# Patient Record
Sex: Female | Born: 1985 | Race: White | Hispanic: No | Marital: Single | Smoking: Current every day smoker
Health system: Southern US, Community
[De-identification: ages and names within clinical notes are randomized; demographics above are authoritative.]

## PROBLEM LIST (undated history)

## (undated) ENCOUNTER — Inpatient Hospital Stay (HOSPITAL_COMMUNITY): Payer: Self-pay

## (undated) DIAGNOSIS — F32A Depression, unspecified: Secondary | ICD-10-CM

## (undated) DIAGNOSIS — F419 Anxiety disorder, unspecified: Secondary | ICD-10-CM

## (undated) DIAGNOSIS — F329 Major depressive disorder, single episode, unspecified: Secondary | ICD-10-CM

## (undated) DIAGNOSIS — G43709 Chronic migraine without aura, not intractable, without status migrainosus: Secondary | ICD-10-CM

## (undated) DIAGNOSIS — D649 Anemia, unspecified: Secondary | ICD-10-CM

## (undated) DIAGNOSIS — F319 Bipolar disorder, unspecified: Secondary | ICD-10-CM

## (undated) HISTORY — PX: DILATION AND CURETTAGE OF UTERUS: SHX78

## (undated) HISTORY — DX: Anxiety disorder, unspecified: F41.9

## (undated) HISTORY — DX: Chronic migraine without aura, not intractable, without status migrainosus: G43.709

## (undated) HISTORY — DX: Major depressive disorder, single episode, unspecified: F32.9

## (undated) HISTORY — DX: Bipolar disorder, unspecified: F31.9

## (undated) HISTORY — DX: Depression, unspecified: F32.A

---

## 2016-08-02 NOTE — L&D Delivery Note (Signed)
Delivery Note At  a viable female was delivered via  (Presentation:vertex ;LOA  ).  APGAR:9 ,9 ; weight  .   Placenta status:spont , shultz.  Cord:  with the following complications: .  Cord pH: n/a  Anesthesia:  epidural Episiotomy:  none Lacerations:   Suture Repair: n/a Est. Blood Loss 100 (mL):    Mom to OR fot BTL.  Baby to Couplet care / Skin to Skin.  [name] 05/01/2017, 2:28 PM

## 2016-10-04 ENCOUNTER — Encounter (HOSPITAL_COMMUNITY): Payer: Self-pay | Admitting: Emergency Medicine

## 2016-10-04 ENCOUNTER — Emergency Department (HOSPITAL_COMMUNITY)
Admission: EM | Admit: 2016-10-04 | Discharge: 2016-10-04 | Disposition: A | Discharge status: Home / Self Care | Payer: Medicaid Other | Attending: Emergency Medicine | Admitting: Emergency Medicine

## 2016-10-04 DIAGNOSIS — Z3A10 10 weeks gestation of pregnancy: Secondary | ICD-10-CM | POA: Diagnosis not present

## 2016-10-04 DIAGNOSIS — F17200 Nicotine dependence, unspecified, uncomplicated: Secondary | ICD-10-CM | POA: Diagnosis not present

## 2016-10-04 DIAGNOSIS — O219 Vomiting of pregnancy, unspecified: Secondary | ICD-10-CM | POA: Insufficient documentation

## 2016-10-04 DIAGNOSIS — Z79899 Other long term (current) drug therapy: Secondary | ICD-10-CM | POA: Insufficient documentation

## 2016-10-04 DIAGNOSIS — O99331 Smoking (tobacco) complicating pregnancy, first trimester: Secondary | ICD-10-CM | POA: Insufficient documentation

## 2016-10-04 LAB — COMPREHENSIVE METABOLIC PANEL
ALBUMIN: 4.2 g/dL (ref 3.5–5.0)
ALT: 30 U/L (ref 14–54)
AST: 21 U/L (ref 15–41)
Alkaline Phosphatase: 65 U/L (ref 38–126)
Anion gap: 9 (ref 5–15)
BILIRUBIN TOTAL: 0.4 mg/dL (ref 0.3–1.2)
BUN: 11 mg/dL (ref 6–20)
CHLORIDE: 106 mmol/L (ref 101–111)
CO2: 21 mmol/L — AB (ref 22–32)
Calcium: 9.6 mg/dL (ref 8.9–10.3)
Creatinine, Ser: 0.55 mg/dL (ref 0.44–1.00)
GFR calc Af Amer: >60 mL/min (ref >60)
GFR calc non Af Amer: >60 mL/min (ref >60)
GLUCOSE: 96 mg/dL (ref 65–99)
POTASSIUM: 3.6 mmol/L (ref 3.5–5.1)
SODIUM: 136 mmol/L (ref 135–145)
TOTAL PROTEIN: 7.4 g/dL (ref 6.5–8.1)

## 2016-10-04 LAB — CBC
HEMATOCRIT: 41.7 % (ref 36.0–46.0)
Hemoglobin: 14.1 g/dL (ref 12.0–15.0)
MCH: 27.9 pg (ref 26.0–34.0)
MCHC: 33.8 g/dL (ref 30.0–36.0)
MCV: 82.6 fL (ref 78.0–100.0)
Platelets: 289 10*3/uL (ref 150–400)
RBC: 5.05 MIL/uL (ref 3.87–5.11)
RDW: 13.7 % (ref 11.5–15.5)
WBC: 10.6 10*3/uL — AB (ref 4.0–10.5)

## 2016-10-04 LAB — URINALYSIS, ROUTINE W REFLEX MICROSCOPIC
BACTERIA UA: NONE SEEN
Glucose, UA: NEGATIVE mg/dL
KETONES UR: 5 mg/dL — AB
LEUKOCYTES UA: NEGATIVE
Nitrite: NEGATIVE
Protein, ur: 30 mg/dL — AB
SPECIFIC GRAVITY, URINE: 1.031 — AB (ref 1.005–1.030)
pH: 5 (ref 5.0–8.0)

## 2016-10-04 LAB — I-STAT BETA HCG BLOOD, ED (MC, WL, AP ONLY)

## 2016-10-04 LAB — LIPASE, BLOOD: Lipase: 18 U/L (ref 11–51)

## 2016-10-04 MED ORDER — ONDANSETRON 4 MG PO TBDP
4.0000 mg | ORAL_TABLET | Freq: Once | ORAL | Status: AC
  Administered 2016-10-04: 4 mg via ORAL
  Filled 2016-10-04: qty 1

## 2016-10-04 MED ORDER — METOCLOPRAMIDE HCL 10 MG PO TABS: 10.0000 mg | ORAL_TABLET | Freq: Three times a day (TID) | ORAL | 0 refills | Status: DC | PRN

## 2016-10-04 NOTE — ED Triage Notes (Signed)
Patient reports she is "4 or [redacted] weeks pregnant." States she has had N/V x2 weeks. Denies abdominal pain, vaginal bleeding, or loss of fluids. Ambulatory to triage.

## 2016-10-04 NOTE — ED Provider Notes (Signed)
[facility]-EMERGENCY DEPT Provider Note   CSN: [id number] Arrival date & time: 10/04/16  1312     History   Chief Complaint Chief Complaint  Patient presents with  . Nausea  . Emesis    HPI Doris King is a 31 y.o. female.  Patient is a 31 year old female who presents with nausea and vomiting. Her last menstrual period was December 24. She recently took a home pregnancy test that was positive. She is G6 P5. She complains of ongoing nausea and vomiting. She states she hasn't been able keep anything down the last few days. She denies abdominal pain. No vaginal bleeding or discharge. She recently moved to the area and does not have an OB/GYN. She denies any urinary symptoms.      History reviewed. No pertinent past medical history.  There are no active problems to display for this patient.   Past Surgical History:  Procedure Laterality Date  . CESAREAN SECTION      OB History    Gravida Para Term Preterm AB Living   1             SAB TAB Ectopic Multiple Live Births                   Home Medications    Prior to Admission medications   Medication Sig Start Date End Date Taking? Authorizing Provider  Prenatal Vit-Fe Fumarate-FA (MULTIVITAMIN-PRENATAL) 27-0.8 MG TABS tablet Take 1 tablet by mouth daily at 12 noon.   Yes Historical Provider, MD  metoCLOPramide (REGLAN) 10 MG tablet Take 1 tablet (10 mg total) by mouth every 8 (eight) hours as needed for nausea (nausea/headache). 10/04/16   Rolan BuccoMelanie Riccardo Holeman, MD    Family History History reviewed. No pertinent family history.  Social History Social History  Substance Use Topics  . Smoking status: Current Every Day Smoker  . Smokeless tobacco: Never Used  . Alcohol use No     Allergies   Patient has no known allergies.   Review of Systems Review of Systems  Constitutional: Negative for chills, diaphoresis, fatigue and fever.  HENT: Negative for congestion, rhinorrhea and sneezing.   Eyes: Negative.     Respiratory: Negative for cough, chest tightness and shortness of breath.   Cardiovascular: Negative for chest pain and leg swelling.  Gastrointestinal: Positive for nausea and vomiting. Negative for abdominal pain, blood in stool and diarrhea.  Genitourinary: Negative for difficulty urinating, flank pain, frequency, hematuria, vaginal bleeding and vaginal discharge.  Musculoskeletal: Negative for arthralgias and back pain.  Skin: Negative for rash.  Neurological: Negative for dizziness, speech difficulty, weakness, numbness and headaches.     Physical Exam Updated Vital Signs BP 130/77 (BP Location: Left Arm)   Pulse 92   Temp 98.6 F (37 C) (Oral)   Resp 18   Ht 5\' 4"  (1.626 m)   Wt 190 lb (86.2 kg)   LMP 07/25/2016   SpO2 95%   BMI 32.61 kg/m   Physical Exam  Constitutional: She is oriented to person, place, and time. She appears well-developed and well-nourished.  HENT:  Head: Normocephalic and atraumatic.  Eyes: Pupils are equal, round, and reactive to light.  Neck: Normal range of motion. Neck supple.  Cardiovascular: Normal rate, regular rhythm and normal heart sounds.   Pulmonary/Chest: Effort normal and breath sounds normal. No respiratory distress. She has no wheezes. She has no rales. She exhibits no tenderness.  Abdominal: Soft. Bowel sounds are normal. There is no tenderness. There is no rebound  and no guarding.  Musculoskeletal: Normal range of motion. She exhibits no edema.  Lymphadenopathy:    She has no cervical adenopathy.  Neurological: She is alert and oriented to person, place, and time.  Skin: Skin is warm and dry. No rash noted.  Psychiatric: She has a normal mood and affect.     ED Treatments / Results  Labs (all labs ordered are listed, but only abnormal results are displayed) Labs Reviewed  COMPREHENSIVE METABOLIC PANEL - Abnormal; Notable for the following:       Result Value   CO2 21 (*)    All other components within normal limits  CBC  - Abnormal; Notable for the following:    WBC 10.6 (*)    All other components within normal limits  URINALYSIS, ROUTINE W REFLEX MICROSCOPIC - Abnormal; Notable for the following:    Color, Urine AMBER (*)    APPearance HAZY (*)    Specific Gravity, Urine 1.031 (*)    Hgb urine dipstick LARGE (*)    Bilirubin Urine SMALL (*)    Ketones, ur 5 (*)    Protein, ur 30 (*)    Squamous Epithelial / LPF 0-5 (*)    All other components within normal limits  I-STAT BETA HCG BLOOD, ED ([facility], [facility], [facility] ONLY) - Abnormal; Notable for the following:    I-stat hCG, quantitative >2,000.0 (*)    All other components within normal limits  LIPASE, BLOOD    EKG  EKG Interpretation None       Radiology No results found.  Procedures Procedures (including critical care time)  Medications Ordered in ED Medications  ondansetron (ZOFRAN-ODT) disintegrating tablet 4 mg (4 mg Oral Given 10/04/16 1802)     Initial Impression / Assessment and Plan / ED Course  I have reviewed the triage vital signs and the nursing notes.  Pertinent labs & imaging results that were available during my care of the patient were reviewed by me and considered in my medical decision making (see chart for details).     Patient presents with nausea and vomiting pregnancy. By dates she is 10 weeks 1 day gestational age. She denies any abdominal pain or vaginal bleeding. Her urinalysis does show some blood but no other signs of infection. She denies any symptoms of a UTI. Given the blood in her urine, a vaginal exam was performed. There is no visible bleeding or abnormal discharge. No tenderness on bimanual exam. She was discharged home in good condition. She was given a dose of Zofran in the ED and was able to tolerate oral fluids. I will give her prescription for Reglan to use at home. She was given other instructions to help prevent nausea during pregnancy. She was encouraged to have close follow-up with an OB/GYN. She was given  referral to the women's outpatient clinic. She was encouraged to start a prenatal vitamin. She was advised to return to emergency department if she has any abdominal pain or vaginal bleeding. She was instructed to go the [facility] in MAU should this occur.  Final Clinical Impressions(s) / ED Diagnoses   Final diagnoses:  Nausea and vomiting in pregnancy    New Prescriptions New Prescriptions   METOCLOPRAMIDE (REGLAN) 10 MG TABLET    Take 1 tablet (10 mg total) by mouth every 8 (eight) hours as needed for nausea (nausea/headache).     Rolan Bucco, MD 10/04/16 [phone]

## 2016-10-04 NOTE — ED Notes (Signed)
Patient given sprite for PO challenge.  

## 2016-10-20 ENCOUNTER — Inpatient Hospital Stay (HOSPITAL_COMMUNITY)
Admission: AD | Admit: 2016-10-20 | Discharge: 2016-10-20 | Disposition: A | Discharge status: Home / Self Care | Payer: Medicaid Other | Source: Ambulatory Visit | Attending: Obstetrics and Gynecology | Admitting: Obstetrics and Gynecology

## 2016-10-20 ENCOUNTER — Encounter (HOSPITAL_COMMUNITY): Payer: Self-pay | Admitting: *Deleted

## 2016-10-20 DIAGNOSIS — R109 Unspecified abdominal pain: Secondary | ICD-10-CM | POA: Diagnosis not present

## 2016-10-20 DIAGNOSIS — O23591 Infection of other part of genital tract in pregnancy, first trimester: Secondary | ICD-10-CM | POA: Diagnosis not present

## 2016-10-20 DIAGNOSIS — B9689 Other specified bacterial agents as the cause of diseases classified elsewhere: Secondary | ICD-10-CM | POA: Diagnosis not present

## 2016-10-20 DIAGNOSIS — O26892 Other specified pregnancy related conditions, second trimester: Secondary | ICD-10-CM | POA: Diagnosis not present

## 2016-10-20 DIAGNOSIS — Z3A12 12 weeks gestation of pregnancy: Secondary | ICD-10-CM | POA: Insufficient documentation

## 2016-10-20 DIAGNOSIS — N760 Acute vaginitis: Secondary | ICD-10-CM

## 2016-10-20 DIAGNOSIS — R102 Pelvic and perineal pain: Secondary | ICD-10-CM | POA: Diagnosis present

## 2016-10-20 DIAGNOSIS — Z87891 Personal history of nicotine dependence: Secondary | ICD-10-CM | POA: Diagnosis not present

## 2016-10-20 HISTORY — DX: Anemia, unspecified: D64.9

## 2016-10-20 LAB — WET PREP, GENITAL
SPERM: NONE SEEN
TRICH WET PREP: NONE SEEN
YEAST WET PREP: NONE SEEN

## 2016-10-20 LAB — URINALYSIS, ROUTINE W REFLEX MICROSCOPIC
Bilirubin Urine: NEGATIVE
GLUCOSE, UA: 150 mg/dL — AB
KETONES UR: 5 mg/dL — AB
Leukocytes, UA: NEGATIVE
Nitrite: NEGATIVE
PH: 5 (ref 5.0–8.0)
PROTEIN: 30 mg/dL — AB
RBC / HPF: NONE SEEN RBC/hpf (ref 0–5)
Specific Gravity, Urine: 1.027 (ref 1.005–1.030)
WBC UA: NONE SEEN WBC/hpf (ref 0–5)

## 2016-10-20 LAB — CBC
HEMATOCRIT: 37.9 % (ref 36.0–46.0)
Hemoglobin: 12.6 g/dL (ref 12.0–15.0)
MCH: 27.8 pg (ref 26.0–34.0)
MCHC: 33.2 g/dL (ref 30.0–36.0)
MCV: 83.7 fL (ref 78.0–100.0)
PLATELETS: 260 10*3/uL (ref 150–400)
RBC: 4.53 MIL/uL (ref 3.87–5.11)
RDW: 13.6 % (ref 11.5–15.5)
WBC: 9.5 10*3/uL (ref 4.0–10.5)

## 2016-10-20 MED ORDER — METRONIDAZOLE 500 MG PO TABS: 500.0000 mg | ORAL_TABLET | Freq: Two times a day (BID) | ORAL | 0 refills | Status: DC

## 2016-10-20 NOTE — Discharge Instructions (Signed)
Abdominal Pain During Pregnancy °Belly (abdominal) pain is common during pregnancy. Most of the time, it is not a serious problem. Other times, it can be a sign that something is wrong with the pregnancy. Always tell your doctor if you have belly pain. °Follow these instructions at home: °Monitor your belly pain for any changes. The following actions may help you feel better: °· Do not have sex (intercourse) or put anything in your vagina until you feel better. °· Rest until your pain stops. °· Drink clear fluids if you feel sick to your stomach (nauseous). Do not eat solid food until you feel better. °· Only take medicine as told by your doctor. °· Keep all doctor visits as told. °Get help right away if: °· You are bleeding, leaking fluid, or pieces of tissue come out of your vagina. °· You have more pain or cramping. °· You keep throwing up (vomiting). °· You have pain when you pee (urinate) or have blood in your pee. °· You have a fever. °· You do not feel your baby moving as much. °· You feel very weak or feel like passing out. °· You have trouble breathing, with or without belly pain. °· You have a very bad headache and belly pain. °· You have fluid leaking from your vagina and belly pain. °· You keep having watery poop (diarrhea). °· Your belly pain does not go away after resting, or the pain gets worse. °This information is not intended to replace advice given to you by your health care provider. Make sure you discuss any questions you have with your health care provider. °Document Released: 07/07/2009 Document Revised: 02/25/2016 Document Reviewed: 02/15/2013 °Elsevier Interactive Patient Education © 2017 Elsevier [employer] ° °

## 2016-10-20 NOTE — MAU Note (Signed)
Urine in lab. No current orders are in

## 2016-10-20 NOTE — MAU Note (Signed)
Pt presents with c/o RLQ abdominal pain which began this morning.  States pain is intermittent & sharp. Denies VB.

## 2016-10-20 NOTE — MAU Provider Note (Signed)
History     CSN: [id number]  Arrival date and time: 10/20/16 1556   First Provider Initiated Contact with Patient 10/20/16 1657      Chief Complaint  Patient presents with  . Abdominal Pain   HPI   [name] is a 31 y.o. female here in MAU with pelvic pain. The pain is located in the middle and near the right side "it feels like the bone". The pain comes and goes. Movement does not exacerbate the pain. The pain has been going on since this morning.   Denies vaginal bleeding.   OB History    Gravida Para Term Preterm AB Living   7 5 4 1 1 5    SAB TAB Ectopic Multiple Live Births   0 1 0 0 5      Past Medical History:  Diagnosis Date  . Anemia     Past Surgical History:  Procedure Laterality Date  . CESAREAN SECTION      History reviewed. No pertinent family history.  Social History  Substance Use Topics  . Smoking status: Former Smoker    Types: Cigarettes    Quit date: 10/06/2016  . Smokeless tobacco: Never Used  . Alcohol use No    Allergies: No Known Allergies  Prescriptions Prior to Admission  Medication Sig Dispense Refill Last Dose  . metoCLOPramide (REGLAN) 10 MG tablet Take 1 tablet (10 mg total) by mouth every 8 (eight) hours as needed for nausea (nausea/headache). 10 tablet 0   . Prenatal Vit-Fe Fumarate-FA (MULTIVITAMIN-PRENATAL) 27-0.8 MG TABS tablet Take 1 tablet by mouth daily at 12 noon.   10/04/2016 at Unknown time   Results for orders placed or performed during the hospital encounter of 10/20/16 (from the past 48 hour(s))  Urinalysis, Routine w reflex microscopic     Status: Abnormal   Collection Time: 10/20/16  4:09 PM  Result Value Ref Range   Color, Urine YELLOW YELLOW   APPearance TURBID (A) CLEAR   Specific Gravity, Urine 1.027 1.005 - 1.030   pH 5.0 5.0 - 8.0   Glucose, UA 150 (A) NEGATIVE mg/dL   Hgb urine dipstick LARGE (A) NEGATIVE   Bilirubin Urine NEGATIVE NEGATIVE   Ketones, ur 5 (A) NEGATIVE mg/dL   Protein, ur 30  (A) NEGATIVE mg/dL   Nitrite NEGATIVE NEGATIVE   Leukocytes, UA NEGATIVE NEGATIVE   RBC / HPF NONE SEEN 0 - 5 RBC/hpf   WBC, UA NONE SEEN 0 - 5 WBC/hpf   Bacteria, UA FEW (A) NONE SEEN   Squamous Epithelial / LPF 6-30 (A) NONE SEEN   Mucous PRESENT   Wet prep, genital     Status: Abnormal   Collection Time: 10/20/16  5:06 PM  Result Value Ref Range   Yeast Wet Prep HPF POC NONE SEEN NONE SEEN   Trich, Wet Prep NONE SEEN NONE SEEN   Clue Cells Wet Prep HPF POC PRESENT (A) NONE SEEN   WBC, Wet Prep HPF POC FEW (A) NONE SEEN    Comment: MODERATE BACTERIA SEEN   Sperm NONE SEEN   CBC     Status: None   Collection Time: 10/20/16  5:41 PM  Result Value Ref Range   WBC 9.5 4.0 - 10.5 K/uL   RBC 4.53 3.87 - 5.11 MIL/uL   Hemoglobin 12.6 12.0 - 15.0 g/dL   HCT 40.937.9 81.136.0 - 91.446.0 %   MCV 83.7 78.0 - 100.0 fL   MCH 27.8 26.0 - 34.0 pg   MCHC 33.2  30.0 - 36.0 g/dL   RDW 98.1 19.1 - 47.8 %   Platelets 260 150 - 400 K/uL    Review of Systems  Constitutional: Negative for fever.  Gastrointestinal: Positive for constipation. Negative for diarrhea, nausea and vomiting.  Genitourinary: Positive for pelvic pain and vaginal bleeding. Negative for dysuria.   Physical Exam   Blood pressure (!) 125/56, pulse 80, temperature 98.6 F (37 C), temperature source Oral, resp. rate 18, height 5\' 4"  (1.626 m), weight 203 lb (92.1 kg), last menstrual period 07/25/2016, SpO2 98 %.  Physical Exam  Constitutional: She is oriented to person, place, and time. She appears well-developed and well-nourished. No distress.  HENT:  Head: Normocephalic.  Eyes: Pupils are equal, round, and reactive to light.  GI: Soft. Normal appearance. There is tenderness in the right lower quadrant and suprapubic area. There is no rigidity, no rebound and no guarding.  Genitourinary:  Genitourinary Comments: Wet prep & GC collected without speculum Cervix: closed, thick, posterior.   Musculoskeletal: Normal range of motion.   Neurological: She is alert and oriented to person, place, and time.  Skin: Skin is warm. She is not diaphoretic.  Psychiatric: Her behavior is normal.    MAU Course  Procedures None.   MDM  Wet prep & GC CBC Urine culture pending + fetal heart tones.    Assessment and Plan   A:  1. Abdominal pain in pregnancy, second trimester   2. BV (bacterial vaginosis)     P:  Discharge home in stable condition Rx: Flagyl List of OB providers given Patient encouraged to start prenatal care.  Return to MAU if symptoms worsen    Duane Lope, NP 10/20/2016 7:15 PM

## 2016-10-21 LAB — GC/CHLAMYDIA PROBE AMP (~~LOC~~) NOT AT ARMC
Chlamydia: NEGATIVE
NEISSERIA GONORRHEA: NEGATIVE

## 2016-10-23 LAB — CULTURE, OB URINE: Special Requests: NORMAL

## 2016-11-11 ENCOUNTER — Other Ambulatory Visit (HOSPITAL_COMMUNITY): Payer: Self-pay | Admitting: Nurse Practitioner

## 2016-11-11 DIAGNOSIS — O09292 Supervision of pregnancy with other poor reproductive or obstetric history, second trimester: Secondary | ICD-10-CM

## 2016-11-11 DIAGNOSIS — Z8751 Personal history of pre-term labor: Secondary | ICD-10-CM

## 2016-11-11 LAB — OB RESULTS CONSOLE ABO/RH: RH Type: POSITIVE

## 2016-11-11 LAB — OB RESULTS CONSOLE HEPATITIS B SURFACE ANTIGEN: Hepatitis B Surface Ag: NEGATIVE

## 2016-11-11 LAB — OB RESULTS CONSOLE HIV ANTIBODY (ROUTINE TESTING): HIV: NONREACTIVE

## 2016-11-11 LAB — OB RESULTS CONSOLE VARICELLA ZOSTER ANTIBODY, IGG: Varicella: NON-IMMUNE/NOT IMMUNE

## 2016-11-11 LAB — OB RESULTS CONSOLE GC/CHLAMYDIA
CHLAMYDIA, DNA PROBE: NEGATIVE
GC PROBE AMP, GENITAL: NEGATIVE

## 2016-11-11 LAB — OB RESULTS CONSOLE ANTIBODY SCREEN: Antibody Screen: NEGATIVE

## 2016-11-11 LAB — OB RESULTS CONSOLE RPR: RPR: NONREACTIVE

## 2016-11-11 LAB — CYTOLOGY - PAP: Pap: ABNORMAL — AB

## 2016-11-11 LAB — CYSTIC FIBROSIS DIAGNOSTIC STUDY: INTERPRETATION-CFDNA: NEGATIVE

## 2016-11-11 LAB — GLUCOSE TOLERANCE, 1 HOUR: GLUCOSE 1 HOUR GTT: 117

## 2016-11-11 LAB — OB RESULTS CONSOLE RUBELLA ANTIBODY, IGM: RUBELLA: IMMUNE

## 2016-11-16 ENCOUNTER — Encounter (HOSPITAL_COMMUNITY): Payer: Self-pay

## 2016-11-22 ENCOUNTER — Encounter: Payer: Self-pay | Admitting: *Deleted

## 2016-11-26 ENCOUNTER — Encounter: Payer: Self-pay | Admitting: *Deleted

## 2016-11-29 ENCOUNTER — Encounter: Payer: Self-pay | Admitting: Obstetrics and Gynecology

## 2016-11-29 ENCOUNTER — Ambulatory Visit (INDEPENDENT_AMBULATORY_CARE_PROVIDER_SITE_OTHER): Payer: Medicaid Other | Admitting: Obstetrics and Gynecology

## 2016-11-29 DIAGNOSIS — F32A Depression, unspecified: Secondary | ICD-10-CM | POA: Insufficient documentation

## 2016-11-29 DIAGNOSIS — O09212 Supervision of pregnancy with history of pre-term labor, second trimester: Secondary | ICD-10-CM | POA: Diagnosis not present

## 2016-11-29 DIAGNOSIS — Z8759 Personal history of other complications of pregnancy, childbirth and the puerperium: Secondary | ICD-10-CM

## 2016-11-29 DIAGNOSIS — Z789 Other specified health status: Secondary | ICD-10-CM

## 2016-11-29 DIAGNOSIS — Z720 Tobacco use: Secondary | ICD-10-CM

## 2016-11-29 DIAGNOSIS — O0990 Supervision of high risk pregnancy, unspecified, unspecified trimester: Secondary | ICD-10-CM | POA: Insufficient documentation

## 2016-11-29 DIAGNOSIS — Z87448 Personal history of other diseases of urinary system: Secondary | ICD-10-CM | POA: Insufficient documentation

## 2016-11-29 DIAGNOSIS — F1210 Cannabis abuse, uncomplicated: Secondary | ICD-10-CM

## 2016-11-29 DIAGNOSIS — Z98891 History of uterine scar from previous surgery: Secondary | ICD-10-CM | POA: Diagnosis not present

## 2016-11-29 DIAGNOSIS — O0932 Supervision of pregnancy with insufficient antenatal care, second trimester: Secondary | ICD-10-CM

## 2016-11-29 DIAGNOSIS — O09892 Supervision of other high risk pregnancies, second trimester: Secondary | ICD-10-CM

## 2016-11-29 DIAGNOSIS — O0992 Supervision of high risk pregnancy, unspecified, second trimester: Secondary | ICD-10-CM

## 2016-11-29 DIAGNOSIS — O99342 Other mental disorders complicating pregnancy, second trimester: Secondary | ICD-10-CM | POA: Diagnosis not present

## 2016-11-29 DIAGNOSIS — O99340 Other mental disorders complicating pregnancy, unspecified trimester: Secondary | ICD-10-CM

## 2016-11-29 DIAGNOSIS — O99212 Obesity complicating pregnancy, second trimester: Secondary | ICD-10-CM

## 2016-11-29 DIAGNOSIS — N879 Dysplasia of cervix uteri, unspecified: Secondary | ICD-10-CM | POA: Diagnosis present

## 2016-11-29 DIAGNOSIS — O0930 Supervision of pregnancy with insufficient antenatal care, unspecified trimester: Secondary | ICD-10-CM

## 2016-11-29 DIAGNOSIS — F329 Major depressive disorder, single episode, unspecified: Secondary | ICD-10-CM | POA: Insufficient documentation

## 2016-11-29 DIAGNOSIS — O99210 Obesity complicating pregnancy, unspecified trimester: Secondary | ICD-10-CM

## 2016-11-29 LAB — POCT URINALYSIS DIP (DEVICE)
Bilirubin Urine: NEGATIVE
Glucose, UA: NEGATIVE mg/dL
KETONES UR: NEGATIVE mg/dL
LEUKOCYTES UA: NEGATIVE
Nitrite: NEGATIVE
PROTEIN: NEGATIVE mg/dL
Specific Gravity, Urine: 1.02 (ref 1.005–1.030)
Urobilinogen, UA: 0.2 mg/dL (ref 0.0–1.0)
pH: 6 (ref 5.0–8.0)

## 2016-11-29 NOTE — Progress Notes (Signed)
New OB Note  11/29/2016   [facility]  Chief Complaint: NOB  Transfer of Care Patient: no  History of Present Illness: [name] is a 31 y.o. [id number] @ 18/1 weeks (EDC [tentative], based on 9/30 Patient's last menstrual period was 07/25/2016.).  Preg complicated by has Cervical dysplasia; History of cesarean delivery; Severe obesity (BMI 35.0-39.9) (HCC); Obesity in pregnancy; History of incarceration; Marijuana abuse; Tobacco abuse; History of preterm delivery, currently pregnant in second trimester; Late prenatal care; Depression, anxiety, bipolar; History of pyelonephritis; Supervision of high risk pregnancy, antepartum; and History of placenta abruption on her problem list.   Any events prior to today's visit: no She has Negative signs or symptoms of nausea/vomiting of pregnancy. She has Negative signs or symptoms of miscarriage or preterm labor: neg On any different medications around the time she conceived/early pregnancy: THC, tobacco  ROS: A 12-point review of systems was performed and negative, except as stated in the above HPI.  OBGYN History: As per HPI. OB History  Gravida Para Term Preterm AB Living  SAB TAB Ectopic Multiple Live Births  0 1 0 0 5    # Outcome Date GA Lbr Len/2nd Weight Sex Delivery Anes PTL Lv  8 Current           7 Preterm 08/2015 [redacted]w[redacted]d            Birth Comments: started bleeding   6 TAB           5 Preterm  [redacted]w[redacted]d         4 Term           3 Term           2 Term           1 Term             Obstetric Comments  G1: 2006. TSVD 6lbs 3oz. No issues  G2: 2007. TSVD. 6lbs 3oz. Multiple pyelo admits  G3: 2010. PPROM at 35-36wks. SVD. 5lbs 8oz  G4: 2011. EAB at 18wks   G5: 2004. TSVD 6lbs 3oz  G6: 2013 urgent c/s for what sounds like abruption. Term. Pt never told she couldn't labor in the future. 6lbs  G7: 2017 SVD. Pt was incarcerated and felt like she had to go to bathroom and then delivered. Didn't have  to go to hospital. She states that they took her to an OB office in [facility] and they removed some "placenta"   Prior children are healthy, doing well, and without any problems or issues: yes History of pap smears: Yes. Last pap smear 10/2016 and results were HSIL. Pt states she has a h/o abnormal paps but denies any surgeries on her cx.    Past Medical History: Past Medical History:  Diagnosis Date  . Anemia   . Anxiety   . Bipolar affective disorder (HCC)   . Depression     Past Surgical History: Past Surgical History:  Procedure Laterality Date  . CESAREAN SECTION    . DILATION AND CURETTAGE OF UTERUS     x2 (rPOCs x 1 and EAB x 1)    Family History:  History reviewed. No pertinent family history. She denies any female cancers, bleeding or blood clotting disorders.  She denies any history of mental retardation, birth defects or genetic disorders in her or the FOB's history  Social History:  Social History   Social History  . Marital status: Single  Spouse name: N/A  . Number of children: N/A  . Years of education: N/A   Occupational History  . Not on file.   Social History Main Topics  . Smoking status: Former Smoker    Types: Cigarettes    Quit date: 10/06/2016  . Smokeless tobacco: Never Used  . Alcohol use No  . Drug use: No  . Sexual activity: Yes    Birth control/ protection: None   Other Topics Concern  . Not on file   Social History Narrative  . 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 Contractions: Not present Vag. Bleeding: None. Fundal height: not applicable FHTs: 140s  General appearance: Well nourished, well developed female in no acute distress.  Neck:  Supple, normal appearance, and no thyromegaly  Cardiovascular: S1, S2  normal, no murmur, rub or gallop, regular rate and rhythm Respiratory:  Clear to auscultation bilateral. Normal respiratory effort Abdomen: positive bowel sounds and no masses, hernias; diffusely non tender to palpation, non distended Neuro/Psych:  Normal mood and affect.  Skin:  Warm and dry.  Lymphatic:  No inguinal lymphadenopathy.   Pelvic exam: is not limited by body habitus EGBUS: within normal limits, Vagina: within normal limits and with no blood in the vault, Cervix: ectropion vs possible cervical dysplasia site (approx 1.5cm x 1.5cm on posterior lip), non friable, cervix without discharge or lesions, closed/long/high, Uterus:  enlarged, c/w 14-16 week size, and Adnexa:  normal adnexa and no mass, fullness, tenderness  Laboratory: 4/17: O pos/CF neg/varicella NI/UDS +THC and tobacco/GC-CT neg/CBC neg/quad neg on 4-12/HIV neg/rubella neg/hepB neg/rpr neg/early 1hr 117  Imaging:  none  Assessment: pt stable  Plan: 1. Cervical dysplasia D/w her importance of follow up and need for colpo. Will schedule at check out.   2. History of cesarean delivery See above. Likely vbac candidate will get her to sign release or records.   3. Severe obesity (BMI 35.0-39.9) (HCC) No issues  4. Obesity in pregnancy No issues. Early 1hr negative.   5. History of incarceration No issues currently  6. Marijuana abuse Not currently using  7. Tobacco abuse Not currently using  8. History of preterm delivery, currently pregnant in second trimester D/w her and she is amenable to 17p and cervical lengths through 24wks. - [place] MFM OB Transvaginal; Future - [place] MFM OB Transvaginal; Future - [place] MFM OB Transvaginal; Future - Ambulatory referral to [facility] - [place] MFM OB Transvaginal; Future  9. Late prenatal care No change in plan of care.   10. Depression during pregnancy, antepartum Also h/o anxiety and bipolar. Pt unable to stay today to see [facility]. Resources given for  walk in clinics   11. History of pyelonephritis Consider qtrimester UCx  12. Supervision of high risk pregnancy, antepartum See above. Finalize edc. Resign BTL papers later in pregnancy.   13. History of placenta abruption No change in plan of care  Problem list reviewed and updated.  Follow up in 2 weeks.  >50% of 30 min visit spent on counseling and coordination of care.     Cornelia Copa MD Attending [facility])

## 2016-12-03 ENCOUNTER — Ambulatory Visit (HOSPITAL_COMMUNITY): Payer: Medicaid Other

## 2016-12-07 ENCOUNTER — Telehealth: Payer: Self-pay | Admitting: *Deleted

## 2016-12-07 NOTE — Telephone Encounter (Addendum)
[facility] yesterday [facility]. She wanted to know if the Prior Berkley Harveyauth has been completed and [facility]'s Makena auto injector. Please notify them when this has been completed.  Pt will be receiving 2 courtesy [facility] will be delivered on 5/9 so that therapy can be initiated while pre-auth is being completed.

## 2016-12-10 ENCOUNTER — Ambulatory Visit (HOSPITAL_COMMUNITY)
Admission: RE | Admit: 2016-12-10 | Discharge: 2016-12-10 | Disposition: A | Discharge status: Home / Self Care | Payer: Medicaid Other | Source: Ambulatory Visit | Attending: Nurse Practitioner | Admitting: Nurse Practitioner

## 2016-12-10 ENCOUNTER — Ambulatory Visit (HOSPITAL_COMMUNITY): Admission: RE | Admit: 2016-12-10 | Payer: Medicaid Other | Source: Ambulatory Visit

## 2016-12-10 ENCOUNTER — Encounter (HOSPITAL_COMMUNITY): Payer: Self-pay

## 2016-12-10 DIAGNOSIS — Z3A19 19 weeks gestation of pregnancy: Secondary | ICD-10-CM | POA: Insufficient documentation

## 2016-12-10 DIAGNOSIS — O09212 Supervision of pregnancy with history of pre-term labor, second trimester: Secondary | ICD-10-CM

## 2016-12-10 DIAGNOSIS — O99212 Obesity complicating pregnancy, second trimester: Secondary | ICD-10-CM | POA: Insufficient documentation

## 2016-12-10 DIAGNOSIS — O09892 Supervision of other high risk pregnancies, second trimester: Secondary | ICD-10-CM

## 2016-12-10 DIAGNOSIS — Z3689 Encounter for other specified antenatal screening: Secondary | ICD-10-CM | POA: Diagnosis not present

## 2016-12-10 DIAGNOSIS — O09299 Supervision of pregnancy with other poor reproductive or obstetric history, unspecified trimester: Secondary | ICD-10-CM | POA: Diagnosis not present

## 2016-12-10 DIAGNOSIS — O0912 Supervision of pregnancy with history of ectopic or molar pregnancy, second trimester: Secondary | ICD-10-CM | POA: Diagnosis not present

## 2016-12-10 DIAGNOSIS — O288 Other abnormal findings on antenatal screening of mother: Secondary | ICD-10-CM | POA: Diagnosis not present

## 2016-12-10 DIAGNOSIS — O09292 Supervision of pregnancy with other poor reproductive or obstetric history, second trimester: Secondary | ICD-10-CM

## 2016-12-10 DIAGNOSIS — Z8751 Personal history of pre-term labor: Secondary | ICD-10-CM

## 2016-12-13 ENCOUNTER — Institutional Professional Consult (permissible substitution): Payer: Medicaid Other

## 2016-12-13 ENCOUNTER — Encounter: Payer: Self-pay | Admitting: Obstetrics and Gynecology

## 2016-12-13 ENCOUNTER — Encounter: Payer: Medicaid Other | Admitting: Obstetrics and Gynecology

## 2016-12-13 DIAGNOSIS — O0990 Supervision of high risk pregnancy, unspecified, unspecified trimester: Secondary | ICD-10-CM

## 2016-12-13 NOTE — Progress Notes (Signed)
Dr. Vergie LivingPickens recommended to contact pt in regards to rescheduling for appt.  Unable to LM due to VM box not set up.  Front office will send letter.

## 2016-12-16 NOTE — Telephone Encounter (Signed)
[facility] to courtesy doses of Makena but was approved for the vials. The vials have been shipped out to the office.

## 2016-12-17 ENCOUNTER — Ambulatory Visit (HOSPITAL_COMMUNITY): Payer: Medicaid Other | Attending: Obstetrics and Gynecology

## 2016-12-31 ENCOUNTER — Ambulatory Visit (HOSPITAL_COMMUNITY): Payer: Medicaid Other | Attending: Obstetrics and Gynecology

## 2016-12-31 ENCOUNTER — Encounter (HOSPITAL_COMMUNITY): Payer: Self-pay

## 2017-01-05 ENCOUNTER — Telehealth: Payer: Self-pay | Admitting: *Deleted

## 2017-01-05 NOTE — Telephone Encounter (Signed)
Noted Doris King's [name] has arrived. I called and was unable to leave a message to tell her her medicine has arrived and we need to make an appt for her first injection.

## 2017-01-07 ENCOUNTER — Encounter: Payer: Self-pay | Admitting: *Deleted

## 2017-01-07 NOTE — Telephone Encounter (Signed)
Called pt and heard message stating that the person does not have a voice mail box set up yet. Per chart review, pt no showed for appt in this office on 5/14 and no showed for US appts on 5/18 and 6/1. She has US scheduled on 6/15 and does not have any future appts scheduled in this office. Certified letter composed and will be sent.

## 2017-01-11 ENCOUNTER — Telehealth: Payer: Self-pay | Admitting: Family Medicine

## 2017-01-11 ENCOUNTER — Encounter: Payer: Self-pay | Admitting: Family Medicine

## 2017-01-11 NOTE — Telephone Encounter (Signed)
Called both numbers in patient chart being her cell and emergency contact (mother). Was able to leave voicemail on mother's phone for patient to call office asap. We have been unsuccessful in reaching patient over several weeks via phone and mailed dnka letter on 01/07/2017. Will send another certified letter today.

## 2017-01-14 ENCOUNTER — Encounter (HOSPITAL_COMMUNITY): Payer: Self-pay

## 2017-01-14 ENCOUNTER — Ambulatory Visit (HOSPITAL_COMMUNITY): Payer: Medicaid Other | Attending: Obstetrics and Gynecology

## 2017-02-10 ENCOUNTER — Encounter (HOSPITAL_COMMUNITY): Payer: Self-pay | Admitting: *Deleted

## 2017-02-10 ENCOUNTER — Inpatient Hospital Stay (HOSPITAL_COMMUNITY)
Admission: AD | Admit: 2017-02-10 | Discharge: 2017-02-10 | Disposition: A | Discharge status: Home / Self Care | Payer: Medicaid Other | Source: Ambulatory Visit | Attending: Obstetrics & Gynecology | Admitting: Obstetrics & Gynecology

## 2017-02-10 ENCOUNTER — Inpatient Hospital Stay (HOSPITAL_COMMUNITY): Payer: Medicaid Other

## 2017-02-10 DIAGNOSIS — O98813 Other maternal infectious and parasitic diseases complicating pregnancy, third trimester: Secondary | ICD-10-CM | POA: Insufficient documentation

## 2017-02-10 DIAGNOSIS — O368330 Maternal care for abnormalities of the fetal heart rate or rhythm, third trimester, not applicable or unspecified: Secondary | ICD-10-CM | POA: Insufficient documentation

## 2017-02-10 DIAGNOSIS — Z3A28 28 weeks gestation of pregnancy: Secondary | ICD-10-CM | POA: Diagnosis not present

## 2017-02-10 DIAGNOSIS — B3731 Acute candidiasis of vulva and vagina: Secondary | ICD-10-CM

## 2017-02-10 DIAGNOSIS — O368390 Maternal care for abnormalities of the fetal heart rate or rhythm, unspecified trimester, not applicable or unspecified: Secondary | ICD-10-CM

## 2017-02-10 DIAGNOSIS — Z79899 Other long term (current) drug therapy: Secondary | ICD-10-CM | POA: Insufficient documentation

## 2017-02-10 DIAGNOSIS — Z87891 Personal history of nicotine dependence: Secondary | ICD-10-CM | POA: Diagnosis not present

## 2017-02-10 DIAGNOSIS — B373 Candidiasis of vulva and vagina: Secondary | ICD-10-CM | POA: Insufficient documentation

## 2017-02-10 DIAGNOSIS — F419 Anxiety disorder, unspecified: Secondary | ICD-10-CM | POA: Insufficient documentation

## 2017-02-10 DIAGNOSIS — F319 Bipolar disorder, unspecified: Secondary | ICD-10-CM | POA: Insufficient documentation

## 2017-02-10 DIAGNOSIS — O4702 False labor before 37 completed weeks of gestation, second trimester: Secondary | ICD-10-CM | POA: Diagnosis not present

## 2017-02-10 DIAGNOSIS — O9989 Other specified diseases and conditions complicating pregnancy, childbirth and the puerperium: Secondary | ICD-10-CM

## 2017-02-10 DIAGNOSIS — O479 False labor, unspecified: Secondary | ICD-10-CM

## 2017-02-10 DIAGNOSIS — B9689 Other specified bacterial agents as the cause of diseases classified elsewhere: Secondary | ICD-10-CM | POA: Diagnosis not present

## 2017-02-10 DIAGNOSIS — O34219 Maternal care for unspecified type scar from previous cesarean delivery: Secondary | ICD-10-CM | POA: Diagnosis not present

## 2017-02-10 DIAGNOSIS — O99343 Other mental disorders complicating pregnancy, third trimester: Secondary | ICD-10-CM | POA: Insufficient documentation

## 2017-02-10 DIAGNOSIS — Z9889 Other specified postprocedural states: Secondary | ICD-10-CM | POA: Diagnosis not present

## 2017-02-10 DIAGNOSIS — N760 Acute vaginitis: Secondary | ICD-10-CM

## 2017-02-10 LAB — URINALYSIS, ROUTINE W REFLEX MICROSCOPIC
BILIRUBIN URINE: NEGATIVE
Glucose, UA: 50 mg/dL — AB
Ketones, ur: NEGATIVE mg/dL
NITRITE: NEGATIVE
PROTEIN: NEGATIVE mg/dL
Specific Gravity, Urine: 1.017 (ref 1.005–1.030)
pH: 6 (ref 5.0–8.0)

## 2017-02-10 LAB — WET PREP, GENITAL
Sperm: NONE SEEN
Trich, Wet Prep: NONE SEEN

## 2017-02-10 LAB — RAPID URINE DRUG SCREEN, HOSP PERFORMED
AMPHETAMINES: NOT DETECTED
BARBITURATES: NOT DETECTED
BENZODIAZEPINES: NOT DETECTED
Cocaine: NOT DETECTED
Opiates: NOT DETECTED
TETRAHYDROCANNABINOL: POSITIVE — AB

## 2017-02-10 LAB — AMNISURE RUPTURE OF MEMBRANE (ROM) NOT AT ARMC: AMNISURE: NEGATIVE

## 2017-02-10 MED ORDER — TERCONAZOLE 0.4 % VA CREA: 1.0000 | TOPICAL_CREAM | Freq: Every day | VAGINAL | 0 refills | Status: DC

## 2017-02-10 MED ORDER — METRONIDAZOLE 500 MG PO TABS: 500.0000 mg | ORAL_TABLET | Freq: Two times a day (BID) | ORAL | 0 refills | Status: DC

## 2017-02-10 NOTE — MAU Provider Note (Signed)
Chief Complaint:  Rupture of Membranes and Contractions   First Provider Initiated Contact with Patient 02/10/17 1506     HPI: Doris King is a 31 y.o. [id number] at 2028w4dwho presents to maternity admissions reporting 1-2 painful braxton hicks contractions per hour and wetness in underwear for 2 weeks.  States was very wet last night.  Has not been in clinic since April   Does not have an explanation for this  States has an appt for July 30.Marland Kitchen. She reports good fetal movement, denies vaginal bleeding, vaginal itching/burning, urinary symptoms, h/a, dizziness, n/v, diarrhea, constipation or fever/chills.    Vaginal Discharge  The patient's primary symptoms include vaginal discharge. The patient's pertinent negatives include no genital itching, genital lesions, genital odor, pelvic pain or vaginal bleeding. This is a new problem. The current episode started 1 to 4 weeks ago. The problem occurs intermittently. The pain is mild. The problem affects both sides. She is pregnant. Associated symptoms include abdominal pain. Pertinent negatives include no chills, constipation, diarrhea or fever.    RN Note: Pt states she has been having "really bad braxton hicks contractions" just about every day for the last 2 weeks. Also reports for the last 2 weeks she has noticed that her underwear are staying wet .   Past Medical History: Past Medical History:  Diagnosis Date  . Anemia   . Anxiety   . Bipolar affective disorder (HCC)   . Depression     Past obstetric history: OB History  Gravida Para Term Preterm AB Living  8 6 4 2 1 5   SAB TAB Ectopic Multiple Live Births  0 1 0 0 5    # Outcome Date GA Lbr Len/2nd Weight Sex Delivery Anes PTL Lv  8 Current           7 Preterm 08/2015 3646w0d            Birth Comments: started bleeding   6 Term 05/14/13 7257w0d  6 lb 8 oz (2.948 kg) M CS-Unspec        Complications: Abruptio Placenta  5 Term 01/13/12 4341w0d  6 lb 7 oz (2.92 kg) M Vag-Spont  N LIV  4 TAB  2011          3 Preterm 11/21/08 2060w3d  5 lb 8 oz (2.495 kg) M Vag-Spont  N LIV  2 Term 12/04/05 7138w6d  6 lb 8 oz (2.948 kg) F Vag-Spont  N LIV  1 Term 11/16/04 7038w6d  6 lb 3 oz (2.807 kg) M Vag-Spont  N LIV    Obstetric Comments  G1: 2006. TSVD 6lbs 3oz. No issues  G2: 2007. TSVD. 6lbs 3oz. Multiple pyelo admits  G3: 2010. PPROM at 35-36wks. SVD. 5lbs 8oz  G4: 2011. EAB at 18wks   G5: 2004. TSVD 6lbs 3oz  G6: 2013 urgent c/s for what sounds like abruption. Term. Pt never told she couldn't labor in the future. 6lbs  G7: 2017 SVD. Pt was incarcerated and felt like she had to go to bathroom and then delivered. Didn't have to go to hospital. She [facility] "placenta"    Past Surgical History: Past Surgical History:  Procedure Laterality Date  . CESAREAN SECTION  2016  . DILATION AND CURETTAGE OF UTERUS     EAB at 18wks    Family History: No family history on file.  Social History: Social History  Substance Use Topics  . Smoking status:  Former Smoker    Types: Cigarettes    Quit date: 10/06/2016  . Smokeless tobacco: Never Used  . Alcohol use No    Allergies: No Known Allergies  Meds:  Prescriptions Prior to Admission  Medication Sig Dispense Refill Last Dose  . acetaminophen (TYLENOL) 325 MG tablet Take 650 mg by mouth every 6 (six) hours as needed.   Taking  . Prenatal Vit-Fe Fumarate-FA (PRENATAL MULTIVITAMIN) TABS tablet Take 1 tablet by mouth daily at 12 noon.   Taking    I have reviewed patient's Past Medical Hx, Surgical Hx, Family Hx, Social Hx, medications and allergies.   ROS:  Review of Systems  Constitutional: Negative for chills and fever.  Gastrointestinal: Positive for abdominal pain. Negative for constipation and diarrhea.  Genitourinary: Positive for vaginal discharge. Negative for pelvic pain.   Other systems negative  Physical Exam  No data found.  Constitutional: Well-developed,  well-nourished female in no acute distress.  Cardiovascular: normal rate and rhythm Respiratory: normal effort, clear to auscultation bilaterally GI: Abd soft, non-tender, gravid appropriate for gestational age.   No rebound or guarding. MS: Extremities nontender, no edema, normal ROM Neurologic: Alert and oriented x 4.  GU: Neg CVAT.  PELVIC EXAM: Cervix pink, visually closed, without lesion, scant white creamy discharge, vaginal walls and external genitalia normal Bimanual exam: Cervix firm, posterior, neg CMT, uterus nontender, Fundal Height consistent with dates, adnexa without tenderness, enlargement, or mass     Cervix long and closed  FHT:  Baseline 140 , moderate variability, accelerations present, two small variable decelerations Contractions:  Irregular  Rare   Labs: No results found for this or any previous visit (from the past 24 hour(s)). O/Positive/-- (04/12 0000)  Imaging:  BPP 8/8 AFI 17.83  MAU Course/MDM: I have ordered labs and reviewed results.  NST reviewed   Other than two small variable decels, FHR is reactive.  BPP was 8/8 and AFI was normal . Will discharge home with Rxes for BV and yeast Has appt in clinic on 7/30  Assessment: [facility] at [redacted]w[redacted]d Yeast vaginitis - Plan: Discharge patient  Fetal heart rate decelerations affecting management of mother - Plan: [place] MFM FETAL BPP WO NON STRESS, [place] MFM FETAL BPP WO NON STRESS  Bacterial vaginosis - Plan: Discharge patient  Braxton Hicks contractions - Plan: Discharge patient    Plan: Discharge home Rx Flagyl for BV Rx Terazol 7 for yeast Preterm Labor precautions and fetal kick counts Follow up in Office for prenatal visits and recheck of status  Encouraged to return here or to other Urgent Care/ED if she develops worsening of symptoms, increase in pain, fever, or other concerning symptoms.   Pt stable at time of discharge.  Wynelle Bourgeois CNM, MSN Certified Nurse-Midwife 02/10/2017 3:06 PM

## 2017-02-10 NOTE — MAU Note (Signed)
Pt states she has been having "really bad braxton hicks contractions" just about every day for the last 2 weeks. Also reports for the last 2 weeks she has noticed that her underwear are staying wet .

## 2017-02-10 NOTE — Discharge Instructions (Signed)
Bacterial Vaginosis Bacterial vaginosis is an infection of the vagina. It happens when too many germs (bacteria) grow in the vagina. This infection puts you at risk for infections from sex (STIs). Treating this infection can lower your risk for some STIs. You should also treat this if you are pregnant. It can cause your baby to be born early. Follow these instructions at home: Medicines  Take over-the-counter and prescription medicines only as told by your doctor.  Take or use your antibiotic medicine as told by your doctor. Do not stop taking or using it even if you start to feel better. General instructions  If you your sexual partner is a woman, tell her that you have this infection. She needs to get treatment if she has symptoms. If you have a female partner, he does not need to be treated.  During treatment: ? Avoid sex. ? Do not douche. ? Avoid alcohol as told. ? Avoid breastfeeding as told.  Drink enough fluid to keep your pee (urine) clear or pale yellow.  Keep your vagina and butt (rectum) clean. ? Wash the area with warm water every day. ? Wipe from front to back after you use the toilet.  Keep all follow-up visits as told by your doctor. This is important. Preventing this condition  Do not douche.  Use only warm water to wash around your vagina.  Use protection when you have sex. This includes: ? Latex condoms. ? Dental dams.  Limit how many people you have sex with. It is best to only have sex with the same person (be monogamous).  Get tested for STIs. Have your partner get tested.  Wear underwear that is cotton or lined with cotton.  Avoid tight pants and pantyhose. This is most important in summer.  Do not use any products that have nicotine or tobacco in them. These include cigarettes and e-cigarettes. If you need help quitting, ask your doctor.  Do not use illegal drugs.  Limit how much alcohol you drink. Contact a doctor if:  Your symptoms do not get  better, even after you are treated.  You have more discharge or pain when you pee (urinate).  You have a fever.  You have pain in your belly (abdomen).  You have pain with sex.  Your bleed from your vagina between periods. Summary  This infection happens when too many germs (bacteria) grow in the vagina.  Treating this condition can lower your risk for some infections from sex (STIs).  You should also treat this if you are pregnant. It can cause early (premature) birth.  Do not stop taking or using your antibiotic medicine even if you start to feel better. This information is not intended to replace advice given to you by your health care provider. Make sure you discuss any questions you have with your health care provider. Document Released: 04/27/2008 Document Revised: 04/03/2016 Document Reviewed: 04/03/2016 Elsevier Interactive Patient Education  2017 Elsevier Inc. Vaginal Yeast infection, Adult Vaginal yeast infection is a condition that causes soreness, swelling, and redness (inflammation) of the vagina. It also causes vaginal discharge. This is a common condition. Some women get this infection frequently. What are the causes? This condition is caused by a change in the normal balance of the yeast (candida) and bacteria that live in the vagina. This change causes an overgrowth of yeast, which causes the inflammation. What increases the risk? This condition is more likely to develop in:  Women who take antibiotic medicines.  Women who have   diabetes.  Women who take birth control pills.  Women who are pregnant.  Women who douche often.  Women who have a weak defense (immune) system.  Women who have been taking steroid medicines for a long time.  Women who frequently wear tight clothing.  What are the signs or symptoms? Symptoms of this condition include:  White, thick vaginal discharge.  Swelling, itching, redness, and irritation of the vagina. The lips of the  vagina (vulva) may be affected as well.  Pain or a burning feeling while urinating.  Pain during sex.  How is this diagnosed? This condition is diagnosed with a medical history and physical exam. This will include a pelvic exam. Your health care provider will examine a sample of your vaginal discharge under a microscope. Your health care provider may send this sample for testing to confirm the diagnosis. How is this treated? This condition is treated with medicine. Medicines may be over-the-counter or prescription. You may be told to use one or more of the following:  Medicine that is taken orally.  Medicine that is applied as a cream.  Medicine that is inserted directly into the vagina (suppository).  Follow these instructions at home:  Take or apply over-the-counter and prescription medicines only as told by your health care provider.  Do not have sex until your health care provider has approved. Tell your sex partner that you have a yeast infection. That person should go to his or her health care provider if he or she develops symptoms.  Do not wear tight clothes, such as pantyhose or tight pants.  Avoid using tampons until your health care provider approves.  Eat more yogurt. This may help to keep your yeast infection from returning.  Try taking a sitz bath to help with discomfort. This is a warm water bath that is taken while you are sitting down. The water should only come up to your hips and should cover your buttocks. Do this 3-4 times per day or as told by your health care provider.  Do not douche.  Wear breathable, cotton underwear.  If you have diabetes, keep your blood sugar levels under control. Contact a health care provider if:  You have a fever.  Your symptoms go away and then return.  Your symptoms do not get better with treatment.  Your symptoms get worse.  You have new symptoms.  You develop blisters in or around your vagina.  You have blood coming  from your vagina and it is not your menstrual period.  You develop pain in your abdomen. This information is not intended to replace advice given to you by your health care provider. Make sure you discuss any questions you have with your health care provider. Document Released: 04/28/2005 Document Revised: 12/31/2015 Document Reviewed: 01/20/2015 Elsevier Interactive Patient Education  2018 Elsevier Inc.  

## 2017-02-11 LAB — GC/CHLAMYDIA PROBE AMP (~~LOC~~) NOT AT ARMC
CHLAMYDIA, DNA PROBE: NEGATIVE
NEISSERIA GONORRHEA: NEGATIVE

## 2017-02-28 ENCOUNTER — Ambulatory Visit (INDEPENDENT_AMBULATORY_CARE_PROVIDER_SITE_OTHER): Payer: Medicaid Other | Admitting: Obstetrics and Gynecology

## 2017-02-28 ENCOUNTER — Encounter: Payer: Self-pay | Admitting: Obstetrics and Gynecology

## 2017-02-28 VITALS — BP 104/58 | HR 90 | Wt 209.7 lb

## 2017-02-28 DIAGNOSIS — O34219 Maternal care for unspecified type scar from previous cesarean delivery: Secondary | ICD-10-CM

## 2017-02-28 DIAGNOSIS — O09212 Supervision of pregnancy with history of pre-term labor, second trimester: Secondary | ICD-10-CM

## 2017-02-28 DIAGNOSIS — O09892 Supervision of other high risk pregnancies, second trimester: Secondary | ICD-10-CM

## 2017-02-28 DIAGNOSIS — Z98891 History of uterine scar from previous surgery: Secondary | ICD-10-CM

## 2017-02-28 DIAGNOSIS — O0990 Supervision of high risk pregnancy, unspecified, unspecified trimester: Secondary | ICD-10-CM

## 2017-02-28 DIAGNOSIS — O0992 Supervision of high risk pregnancy, unspecified, second trimester: Secondary | ICD-10-CM

## 2017-02-28 DIAGNOSIS — K219 Gastro-esophageal reflux disease without esophagitis: Secondary | ICD-10-CM | POA: Insufficient documentation

## 2017-02-28 NOTE — Progress Notes (Signed)
Subjective:  Doris King is a 31 y.o. [id number] at 5289w1d being seen today for ongoing prenatal care.  She is currently monitored for the following issues for this high-risk pregnancy and has Cervical dysplasia; History of cesarean delivery; Severe obesity (BMI 35.0-39.9) (HCC); Obesity in pregnancy; History of incarceration; Marijuana abuse; Tobacco abuse; History of preterm delivery, currently pregnant in second trimester; Late prenatal care; Depression, anxiety, bipolar; History of pyelonephritis; Supervision of high risk pregnancy, antepartum; History of placenta abruption; and GERD (gastroesophageal reflux disease) on her problem list.  Patient reports headache.  Contractions: Not present. Vag. Bleeding: None.  Movement: Present. Denies leaking of fluid.   The following portions of the patient's history were reviewed and updated as appropriate: allergies, current medications, past family history, past medical history, past social history, past surgical history and problem list. Problem list updated.  Objective:   Vitals:   02/28/17 1551  BP: (!) 104/58  Pulse: 90  Weight: 209 lb 11.2 oz (95.1 kg)    Fetal Status: Fetal Heart Rate (bpm): 145   Movement: Present     General:  Alert, oriented and cooperative. Patient is in no acute distress.  Skin: Skin is warm and dry. No rash noted.   Cardiovascular: Normal heart rate noted  Respiratory: Normal respiratory effort, no problems with respiration noted  Abdomen: Soft, gravid, appropriate for gestational age. Pain/Pressure: Present     Pelvic:  Cervical exam deferred        Extremities: Normal range of motion.  Edema: None  Mental Status: Normal mood and affect. Normal behavior. Normal judgment and thought content.   Urinalysis:      Assessment and Plan:  Pregnancy: [id number] at 6489w1d  1. Supervision of high risk pregnancy, antepartum Pt has had several deaths in the family has had to be out of town, thus has missed OB appts Reviewed  importance of OB appts Schedule U/S to complete anatomy Schedule 28 week labs this week - US MFM OB COMP + 14 WK; Future  2. History of cesarean delivery Desires TOLAC, papers signed today  3. History of preterm delivery, currently pregnant in second trimester   4. Gastroesophageal reflux disease without esophagitis Zantac  5. Unwanted fertility Papers signed today Preterm labor symptoms and general obstetric precautions including but not limited to vaginal bleeding, contractions, leaking of fluid and fetal movement were reviewed in detail with the patient. Please refer to After Visit Summary for other counseling recommendations.  Return in about 2 weeks (around 03/14/2017).   Hermina StaggersErvin, Rozalynn Buege L, MD

## 2017-03-02 ENCOUNTER — Ambulatory Visit (HOSPITAL_COMMUNITY): Admission: RE | Admit: 2017-03-02 | Payer: Medicaid Other | Source: Ambulatory Visit

## 2017-03-03 ENCOUNTER — Other Ambulatory Visit: Payer: Medicaid Other

## 2017-03-14 ENCOUNTER — Encounter: Payer: Self-pay | Admitting: Family Medicine

## 2017-03-14 ENCOUNTER — Ambulatory Visit (INDEPENDENT_AMBULATORY_CARE_PROVIDER_SITE_OTHER): Payer: Medicaid Other | Admitting: Family Medicine

## 2017-03-14 VITALS — BP 125/69 | HR 86 | Wt 210.5 lb

## 2017-03-14 DIAGNOSIS — K219 Gastro-esophageal reflux disease without esophagitis: Secondary | ICD-10-CM

## 2017-03-14 DIAGNOSIS — Z23 Encounter for immunization: Secondary | ICD-10-CM | POA: Diagnosis not present

## 2017-03-14 DIAGNOSIS — Z98891 History of uterine scar from previous surgery: Secondary | ICD-10-CM

## 2017-03-14 DIAGNOSIS — O0993 Supervision of high risk pregnancy, unspecified, third trimester: Secondary | ICD-10-CM | POA: Diagnosis not present

## 2017-03-14 DIAGNOSIS — O34219 Maternal care for unspecified type scar from previous cesarean delivery: Secondary | ICD-10-CM | POA: Diagnosis not present

## 2017-03-14 DIAGNOSIS — O0990 Supervision of high risk pregnancy, unspecified, unspecified trimester: Secondary | ICD-10-CM

## 2017-03-14 MED ORDER — FAMOTIDINE 20 MG PO TABS: 20.0000 mg | ORAL_TABLET | Freq: Two times a day (BID) | ORAL | 3 refills | Status: DC

## 2017-03-14 NOTE — Patient Instructions (Signed)

## 2017-03-14 NOTE — Progress Notes (Signed)
   PRENATAL VISIT NOTE  Subjective:  Doris King is a 31 y.o. [id number] at 324w1d being seen today for ongoing prenatal care.  She is currently monitored for the following issues for this high-risk pregnancy and has Cervical dysplasia; History of cesarean delivery; Severe obesity (BMI 35.0-39.9) (HCC); Obesity in pregnancy; History of incarceration; Marijuana abuse; Tobacco abuse; History of preterm delivery, currently pregnant in second trimester; Late prenatal care; Depression, anxiety, bipolar; History of pyelonephritis; Supervision of high risk pregnancy, antepartum; History of placenta abruption; and GERD (gastroesophageal reflux disease) on her problem list.  Patient reports no complaints.  Contractions: Irregular. Vag. Bleeding: None.  Movement: Present. Denies leaking of fluid.   The following portions of the patient's history were reviewed and updated as appropriate: allergies, current medications, past family history, past medical history, past social history, past surgical history and problem list. Problem list updated.  Objective:   Vitals:   03/14/17 1534  BP: 125/69  Pulse: 86  Weight: 210 lb 8 oz (95.5 kg)    Fetal Status: Fetal Heart Rate (bpm): 145   Movement: Present     General:  Alert, oriented and cooperative. Patient is in no acute distress.  Skin: Skin is warm and dry. No rash noted.   Cardiovascular: Normal heart rate noted  Respiratory: Normal respiratory effort, no problems with respiration noted  Abdomen: Soft, gravid, appropriate for gestational age.  Pain/Pressure: Absent     Pelvic: Cervical exam deferred        Extremities: Normal range of motion.  Edema: Trace  Mental Status:  Normal mood and affect. Normal behavior. Normal judgment and thought content.   Assessment and Plan:  Pregnancy: [id number] at 9524w1d  1. Supervision of high risk pregnancy, antepartum FHT and Fh normal. Patient cannot come for fasting 2hr GTT. Will get 1hr GTT. If positive, would  start on testing as patient cannot come for fasting 3hr GTT. - Tdap vaccine greater than or equal to 7yo IM  2. Gastroesophageal reflux disease without esophagitis Pepcid  3. History of cesarean delivery Would like TOLAC  Preterm labor symptoms and general obstetric precautions including but not limited to vaginal bleeding, contractions, leaking of fluid and fetal movement were reviewed in detail with the patient. Please refer to After Visit Summary for other counseling recommendations.  Return in about 2 weeks (around 03/28/2017) for OB f/u.   Levie HeritageJacob J Stinson, DO

## 2017-03-25 ENCOUNTER — Telehealth: Payer: Self-pay | Admitting: *Deleted

## 2017-03-25 NOTE — Telephone Encounter (Signed)
Patient left message, lost mucous plug. Does she need to do anything? Please call back.

## 2017-03-26 ENCOUNTER — Inpatient Hospital Stay (HOSPITAL_COMMUNITY)
Admission: AD | Admit: 2017-03-26 | Discharge: 2017-03-26 | Disposition: A | Discharge status: Home / Self Care | Payer: Medicaid Other | Source: Ambulatory Visit | Attending: Obstetrics and Gynecology | Admitting: Obstetrics and Gynecology

## 2017-03-26 ENCOUNTER — Encounter (HOSPITAL_COMMUNITY): Payer: Self-pay

## 2017-03-26 DIAGNOSIS — F419 Anxiety disorder, unspecified: Secondary | ICD-10-CM | POA: Insufficient documentation

## 2017-03-26 DIAGNOSIS — Z87891 Personal history of nicotine dependence: Secondary | ICD-10-CM | POA: Insufficient documentation

## 2017-03-26 DIAGNOSIS — N898 Other specified noninflammatory disorders of vagina: Secondary | ICD-10-CM | POA: Diagnosis not present

## 2017-03-26 DIAGNOSIS — O99343 Other mental disorders complicating pregnancy, third trimester: Secondary | ICD-10-CM | POA: Diagnosis present

## 2017-03-26 DIAGNOSIS — O4703 False labor before 37 completed weeks of gestation, third trimester: Secondary | ICD-10-CM

## 2017-03-26 DIAGNOSIS — Z9889 Other specified postprocedural states: Secondary | ICD-10-CM | POA: Insufficient documentation

## 2017-03-26 DIAGNOSIS — F319 Bipolar disorder, unspecified: Secondary | ICD-10-CM | POA: Diagnosis not present

## 2017-03-26 DIAGNOSIS — Z3A34 34 weeks gestation of pregnancy: Secondary | ICD-10-CM | POA: Insufficient documentation

## 2017-03-26 LAB — URINALYSIS, ROUTINE W REFLEX MICROSCOPIC
BILIRUBIN URINE: NEGATIVE
Glucose, UA: NEGATIVE mg/dL
Ketones, ur: NEGATIVE mg/dL
Nitrite: NEGATIVE
PROTEIN: NEGATIVE mg/dL
Specific Gravity, Urine: 1.025 (ref 1.005–1.030)
pH: 6 (ref 5.0–8.0)

## 2017-03-26 NOTE — Discharge Instructions (Signed)

## 2017-03-26 NOTE — MAU Provider Note (Signed)
History   [id number] @ 34.6 wks in with c/o losing mucous plug and off and on brackston hicks contractions.. Denies vag bleeding or ROM. States good fetal movement.  CSN: [id number]  Arrival date & time 03/26/17  1357   None     Chief Complaint  Patient presents with  . Vaginal Discharge    HPI  Past Medical History:  Diagnosis Date  . Anemia   . Anxiety   . Bipolar affective disorder (HCC)   . Depression     Past Surgical History:  Procedure Laterality Date  . CESAREAN SECTION  2016  . DILATION AND CURETTAGE OF UTERUS     EAB at 18wks    History reviewed. No pertinent family history.  Social History  Substance Use Topics  . Smoking status: Former Smoker    Types: Cigarettes    Quit date: 10/06/2016  . Smokeless tobacco: Never Used  . Alcohol use No    OB History    Gravida Para Term Preterm AB Living   8 6 4 2 1 5    SAB TAB Ectopic Multiple Live Births   0 1 0 0 5      Obstetric Comments   G1: 2006. TSVD 6lbs 3oz. No issues G2: 2007. TSVD. 6lbs 3oz. Multiple pyelo admits G3: 2010. PPROM at 35-36wks. SVD. 5lbs 8oz G4: 2011. EAB at 18wks  G5: 2004. TSVD 6lbs 3oz G6: 2013 urgent c/s for what sounds like abruption. Term. Pt never told she  couldn't labor in the future. 6lbs G7: 2017 SVD. Pt was incarcerated and felt like she had to go to bathroom and then delivered. Didn't have to go to hospital. She states that they took her to an OB office in [facility] and they removed some "placenta "      Review of Systems  Constitutional: Negative.   HENT: Negative.   Eyes: Negative.   Respiratory: Negative.   Cardiovascular: Negative.   Gastrointestinal: Positive for abdominal pain.  Endocrine: Negative.   Genitourinary: Negative.   Musculoskeletal: Negative.   Skin: Negative.   Allergic/Immunologic: Negative.   Neurological: Negative.   Hematological: Negative.   Psychiatric/Behavioral: Negative.     Allergies  Patient has no known allergies.  Home  Medications    Temp 99.1 F (37.3 C) (Oral)   LMP 07/25/2016   Physical Exam  Constitutional: She is oriented to person, place, and time. She appears well-developed and well-nourished.  HENT:  Head: Normocephalic.  Eyes: Pupils are equal, round, and reactive to light.  Neck: Normal range of motion.  Cardiovascular: Normal rate, regular rhythm, normal heart sounds and intact distal pulses.   Pulmonary/Chest: Effort normal and breath sounds normal.  Abdominal: Soft. Bowel sounds are normal.  Genitourinary: Vagina normal and uterus normal.  Musculoskeletal: Normal range of motion.  Neurological: She is alert and oriented to person, place, and time. She has normal reflexes.  Skin: Skin is warm and dry.  Psychiatric: She has a normal mood and affect. Her behavior is normal. Judgment and thought content normal.    MAU Course  Procedures (including critical care time)  Labs Reviewed  URINALYSIS, ROUTINE W REFLEX MICROSCOPIC - Abnormal; Notable for the following:       Result Value   APPearance CLOUDY (*)    Hgb urine dipstick SMALL (*)    Leukocytes, UA TRACE (*)    Bacteria, UA RARE (*)    Squamous Epithelial / LPF 0-5 (*)    All other components within normal limits  No results found.   1. False labor before 37 completed weeks of gestation in third trimester       MDM  FHR 150's with accels no decels , mod variability with 15x15 accels. No uc's at present time. SVE ft/firm/post/high. Will d/c home not in labor.

## 2017-03-26 NOTE — Progress Notes (Addendum)
Went to discharge pt. Noted deep variable down to 75 for 55 seconds. moitor put back on. Will cont to monitor.   1445: MD made aware of the decel. Ok to monitor few more minutes before discharging pt due to decel.

## 2017-03-26 NOTE — MAU Note (Signed)
Pt is a G8P6 at  34.6 weeks, c/o loss of mucus plug and back pain.  No other oB or medical concerns.

## 2017-03-28 ENCOUNTER — Ambulatory Visit (INDEPENDENT_AMBULATORY_CARE_PROVIDER_SITE_OTHER): Payer: Medicaid Other | Admitting: Obstetrics and Gynecology

## 2017-03-28 ENCOUNTER — Other Ambulatory Visit (HOSPITAL_COMMUNITY)
Admission: RE | Admit: 2017-03-28 | Discharge: 2017-03-28 | Disposition: A | Discharge status: Home / Self Care | Payer: Medicaid Other | Source: Ambulatory Visit | Attending: Obstetrics and Gynecology | Admitting: Obstetrics and Gynecology

## 2017-03-28 VITALS — BP 101/58 | HR 86 | Wt 210.9 lb

## 2017-03-28 DIAGNOSIS — O0993 Supervision of high risk pregnancy, unspecified, third trimester: Secondary | ICD-10-CM | POA: Diagnosis not present

## 2017-03-28 DIAGNOSIS — Z3A35 35 weeks gestation of pregnancy: Secondary | ICD-10-CM | POA: Diagnosis not present

## 2017-03-28 DIAGNOSIS — O0990 Supervision of high risk pregnancy, unspecified, unspecified trimester: Secondary | ICD-10-CM

## 2017-03-28 DIAGNOSIS — O99210 Obesity complicating pregnancy, unspecified trimester: Secondary | ICD-10-CM

## 2017-03-28 DIAGNOSIS — Z98891 History of uterine scar from previous surgery: Secondary | ICD-10-CM

## 2017-03-28 LAB — OB RESULTS CONSOLE GBS: STREP GROUP B AG: NEGATIVE

## 2017-03-28 MED ORDER — COMFORT FIT MATERNITY SUPP MED MISC: 0 refills | Status: DC

## 2017-03-28 MED ORDER — PANTOPRAZOLE SODIUM 40 MG PO TBEC: 40.0000 mg | DELAYED_RELEASE_TABLET | Freq: Every day | ORAL | 1 refills | Status: DC

## 2017-03-28 NOTE — Progress Notes (Signed)
   PRENATAL VISIT NOTE  Subjective:  Doris King is a 31 y.o. [id number] at [redacted]w[redacted]d being seen today for ongoing prenatal care.  She is currently monitored for the following issues for this high-risk pregnancy and has Cervical dysplasia; History of cesarean delivery; Severe obesity (BMI 35.0-39.9) (HCC); Obesity in pregnancy; History of incarceration; Marijuana abuse; Tobacco abuse; History of preterm delivery, currently pregnant in second trimester; Late prenatal care; Depression, anxiety, bipolar; History of pyelonephritis; Supervision of high risk pregnancy, antepartum; History of placenta abruption; and GERD (gastroesophageal reflux disease) on her problem list.  Patient reports no complaints.  Contractions: Irregular. Vag. Bleeding: None.  Movement: Present. Denies leaking of fluid.   The following portions of the patient's history were reviewed and updated as appropriate: allergies, current medications, past family history, past medical history, past social history, past surgical history and problem list. Problem list updated.  Objective:   Vitals:   03/28/17 1549  BP: (!) 101/58  Pulse: 86  Weight: 210 lb 14.4 oz (95.7 kg)    Fetal Status: Fetal Heart Rate (bpm): 134 Fundal Height: 35 cm Movement: Present     General:  Alert, oriented and cooperative. Patient is in no acute distress.  Skin: Skin is warm and dry. No rash noted.   Cardiovascular: Normal heart rate noted  Respiratory: Normal respiratory effort, no problems with respiration noted  Abdomen: Soft, gravid, appropriate for gestational age.  Pain/Pressure: Present     Pelvic: Cervical exam performed Dilation: Fingertip Effacement (%): Thick Station: Ballotable  Extremities: Normal range of motion.  Edema: Trace  Mental Status:  Normal mood and affect. Normal behavior. Normal judgment and thought content.   Assessment and Plan:  Pregnancy: [id number] at [redacted]w[redacted]d  1. Supervision of high risk pregnancy, antepartum Patient is  doing well without complaints Cultures collected today - GC/Chlamydia probe amp ([place])not at [facility] - Culture, beta strep (group b only)  2. History of cesarean delivery Desires TOLAC  3. Obesity in pregnancy Patient came in too late for 1 hr glucola. Patient to have test done at her next visit  Preterm labor symptoms and general obstetric precautions including but not limited to vaginal bleeding, contractions, leaking of fluid and fetal movement were reviewed in detail with the patient. Please refer to After Visit Summary for other counseling recommendations.  Return in about 1 week (around 04/04/2017) for ROB with cultures.   Catalina Antigua, MD

## 2017-03-29 LAB — GC/CHLAMYDIA PROBE AMP (~~LOC~~) NOT AT ARMC
Chlamydia: NEGATIVE
NEISSERIA GONORRHEA: NEGATIVE

## 2017-04-01 LAB — CULTURE, BETA STREP (GROUP B ONLY): STREP GP B CULTURE: NEGATIVE

## 2017-04-05 ENCOUNTER — Other Ambulatory Visit: Payer: Self-pay | Admitting: Obstetrics and Gynecology

## 2017-04-05 ENCOUNTER — Encounter: Payer: Self-pay | Admitting: Obstetrics and Gynecology

## 2017-04-05 ENCOUNTER — Ambulatory Visit (INDEPENDENT_AMBULATORY_CARE_PROVIDER_SITE_OTHER): Payer: Self-pay | Admitting: Obstetrics and Gynecology

## 2017-04-05 VITALS — BP 121/67 | HR 98 | Wt 215.5 lb

## 2017-04-05 DIAGNOSIS — O99213 Obesity complicating pregnancy, third trimester: Secondary | ICD-10-CM

## 2017-04-05 DIAGNOSIS — Z3009 Encounter for other general counseling and advice on contraception: Secondary | ICD-10-CM | POA: Insufficient documentation

## 2017-04-05 DIAGNOSIS — O0990 Supervision of high risk pregnancy, unspecified, unspecified trimester: Secondary | ICD-10-CM

## 2017-04-05 DIAGNOSIS — O99210 Obesity complicating pregnancy, unspecified trimester: Secondary | ICD-10-CM

## 2017-04-05 DIAGNOSIS — O0993 Supervision of high risk pregnancy, unspecified, third trimester: Secondary | ICD-10-CM

## 2017-04-05 DIAGNOSIS — Z98891 History of uterine scar from previous surgery: Secondary | ICD-10-CM

## 2017-04-05 NOTE — Progress Notes (Signed)
Patient here today for routine OB visit 8438w2d.

## 2017-04-05 NOTE — Patient Instructions (Signed)
Vaginal Delivery Vaginal delivery means that you will give birth by pushing your baby out of your birth canal (vagina). A team of health care providers will help you before, during, and after vaginal delivery. Birth experiences are unique for every woman and every pregnancy, and birth experiences vary depending on where you choose to give birth. What should I do to prepare for my baby's birth? Before your baby is born, it is important to talk with your health care provider about:  Your labor and delivery preferences. These may include: ? Medicines that you may be given. ? How you will manage your pain. This might include non-medical pain relief techniques or injectable pain relief such as epidural analgesia. ? How you and your baby will be monitored during labor and delivery. ? Who may be in the labor and delivery room with you. ? Your feelings about surgical delivery of your baby (cesarean delivery, or C-section) if this becomes necessary. ? Your feelings about receiving donated blood through an IV tube (blood transfusion) if this becomes necessary.  Whether you are able: ? To take pictures or videos of the birth. ? To eat during labor and delivery. ? To move around, walk, or change positions during labor and delivery.  What to expect after your baby is born, such as: ? Whether delayed umbilical cord clamping and cutting is offered. ? Who will care for your baby right after birth. ? Medicines or tests that may be recommended for your baby. ? Whether breastfeeding is supported in your hospital or birth center. ? How long you will be in the hospital or birth center.  How any medical conditions you have may affect your baby or your labor and delivery experience.  To prepare for your baby's birth, you should also:  Attend all of your health care visits before delivery (prenatal visits) as recommended by your health care provider. This is important.  Prepare your home for your baby's  arrival. Make sure that you have: ? Diapers. ? Baby clothing. ? Feeding equipment. ? Safe sleeping arrangements for you and your baby.  Install a car seat in your vehicle. Have your car seat checked by a certified car seat installer to make sure that it is installed safely.  Think about who will help you with your new baby at home for at least the first several weeks after delivery.  What can I expect when I arrive at the birth center or hospital? Once you are in labor and have been admitted into the hospital or birth center, your health care provider may:  Review your pregnancy history and any concerns you have.  Insert an IV tube into one of your veins. This is used to give you fluids and medicines.  Check your blood pressure, pulse, temperature, and heart rate (vital signs).  Check whether your bag of water (amniotic sac) has broken (ruptured).  Talk with you about your birth plan and discuss pain control options.  Monitoring Your health care provider may monitor your contractions (uterine monitoring) and your baby's heart rate (fetal monitoring). You may need to be monitored:  Often, but not continuously (intermittently).  All the time or for long periods at a time (continuously). Continuous monitoring may be needed if: ? You are taking certain medicines, such as medicine to relieve pain or make your contractions stronger. ? You have pregnancy or labor complications.  Monitoring may be done by:  Placing a special stethoscope or a handheld monitoring device on your abdomen to   check your baby's heartbeat, and feeling your abdomen for contractions. This method of monitoring does not continuously record your baby's heartbeat or your contractions.  Placing monitors on your abdomen (external monitors) to record your baby's heartbeat and the frequency and length of contractions. You may not have to wear external monitors all the time.  Placing monitors inside of your uterus  (internal monitors) to record your baby's heartbeat and the frequency, length, and strength of your contractions. ? Your health care provider may use internal monitors if he or she needs more information about the strength of your contractions or your baby's heart rate. ? Internal monitors are put in place by passing a thin, flexible wire through your vagina and into your uterus. Depending on the type of monitor, it may remain in your uterus or on your baby's head until birth. ? Your health care provider will discuss the benefits and risks of internal monitoring with you and will ask for your permission before inserting the monitors.  Telemetry. This is a type of continuous monitoring that can be done with external or internal monitors. Instead of having to stay in bed, you are able to move around during telemetry. Ask your health care provider if telemetry is an option for you.  Physical exam Your health care provider may perform a physical exam. This may include:  Checking whether your baby is positioned: ? With the head toward your vagina (head-down). This is most common. ? With the head toward the top of your uterus (head-up or breech). If your baby is in a breech position, your health care provider may try to turn your baby to a head-down position so you can deliver vaginally. If it does not seem that your baby can be born vaginally, your provider may recommend surgery to deliver your baby. In rare cases, you may be able to deliver vaginally if your baby is head-up (breech delivery). ? Lying sideways (transverse). Babies that are lying sideways cannot be delivered vaginally.  Checking your cervix to determine: ? Whether it is thinning out (effacing). ? Whether it is opening up (dilating). ? How low your baby has moved into your birth canal.  What are the three stages of labor and delivery?  Normal labor and delivery is divided into the following three stages: Stage 1  Stage 1 is the  longest stage of labor, and it can last for hours or days. Stage 1 includes: ? Early labor. This is when contractions may be irregular, or regular and mild. Generally, early labor contractions are more than 10 minutes apart. ? Active labor. This is when contractions get longer, more regular, more frequent, and more intense. ? The transition phase. This is when contractions happen very close together, are very intense, and may last longer than during any other part of labor.  Contractions generally feel mild, infrequent, and irregular at first. They get stronger, more frequent (about every 2-3 minutes), and more regular as you progress from early labor through active labor and transition.  Many women progress through stage 1 naturally, but you may need help to continue making progress. If this happens, your health care provider may talk with you about: ? Rupturing your amniotic sac if it has not ruptured yet. ? Giving you medicine to help make your contractions stronger and more frequent.  Stage 1 ends when your cervix is completely dilated to 4 inches (10 cm) and completely effaced. This happens at the end of the transition phase. Stage 2  Once   your cervix is completely effaced and dilated to 4 inches (10 cm), you may start to feel an urge to push. It is common for the body to naturally take a rest before feeling the urge to push, especially if you received an epidural or certain other pain medicines. This rest period may last for up to 1-2 hours, depending on your unique labor experience.  During stage 2, contractions are generally less painful, because pushing helps relieve contraction pain. Instead of contraction pain, you may feel stretching and burning pain, especially when the widest part of your baby's head passes through the vaginal opening (crowning).  Your health care provider will closely monitor your pushing progress and your baby's progress through the vagina during stage 2.  Your  health care provider may massage the area of skin between your vaginal opening and anus (perineum) or apply warm compresses to your perineum. This helps it stretch as the baby's head starts to crown, which can help prevent perineal tearing. ? In some cases, an incision may be made in your perineum (episiotomy) to allow the baby to pass through the vaginal opening. An episiotomy helps to make the opening of the vagina larger to allow more room for the baby to fit through.  It is very important to breathe and focus so your health care provider can control the delivery of your baby's head. Your health care provider may have you decrease the intensity of your pushing, to help prevent perineal tearing.  After delivery of your baby's head, the shoulders and the rest of the body generally deliver very quickly and without difficulty.  Once your baby is delivered, the umbilical cord may be cut right away, or this may be delayed for 1-2 minutes, depending on your baby's health. This may vary among health care providers, hospitals, and birth centers.  If you and your baby are healthy enough, your baby may be placed on your chest or abdomen to help maintain the baby's temperature and to help you bond with each other. Some mothers and babies start breastfeeding at this time. Your health care team will dry your baby and help keep your baby warm during this time.  Your baby may need immediate care if he or she: ? Showed signs of distress during labor. ? Has a medical condition. ? Was born too early (prematurely). ? Had a bowel movement before birth (meconium). ? Shows signs of difficulty transitioning from being inside the uterus to being outside of the uterus. If you are planning to breastfeed, your health care team will help you begin a feeding. Stage 3  The third stage of labor starts immediately after the birth of your baby and ends after you deliver the placenta. The placenta is an organ that develops  during pregnancy to provide oxygen and nutrients to your baby in the womb.  Delivering the placenta may require some pushing, and you may have mild contractions. Breastfeeding can stimulate contractions to help you deliver the placenta.  After the placenta is delivered, your uterus should tighten (contract) and become firm. This helps to stop bleeding in your uterus. To help your uterus contract and to control bleeding, your health care provider may: ? Give you medicine by injection, through an IV tube, by mouth, or through your rectum (rectally). ? Massage your abdomen or perform a vaginal exam to remove any blood clots that are left in your uterus. ? Empty your bladder by placing a thin, flexible tube (catheter) into your bladder. ? Encourage   you to breastfeed your baby. After labor is over, you and your baby will be monitored closely to ensure that you are both healthy until you are ready to go home. Your health care team will teach you how to care for yourself and your baby. This information is not intended to replace advice given to you by your health care provider. Make sure you discuss any questions you have with your health care provider. Document Released: 04/27/2008 Document Revised: 02/06/2016 Document Reviewed: 08/03/2015 Elsevier Interactive Patient Education  2018 [employer]  

## 2017-04-05 NOTE — Progress Notes (Signed)
Subjective:  Doris King is a 31 y.o. [id number] at 7573w2d being seen today for ongoing prenatal care.  She is currently monitored for the following issues for this high-risk pregnancy and has Cervical dysplasia; History of cesarean delivery; Severe obesity (BMI 35.0-39.9) (HCC); Obesity in pregnancy; History of incarceration; Marijuana abuse; Tobacco abuse; History of preterm delivery, currently pregnant in second trimester; Late prenatal care; Depression, anxiety, bipolar; History of pyelonephritis; Supervision of high risk pregnancy, antepartum; History of placenta abruption; GERD (gastroesophageal reflux disease); and Unwanted fertility on her problem list.  Patient reports general discomforts of pregnancy.  Contractions: Irregular. Vag. Bleeding: None.  Movement: Present. Denies leaking of fluid.   The following portions of the patient's history were reviewed and updated as appropriate: allergies, current medications, past family history, past medical history, past social history, past surgical history and problem list. Problem list updated.  Objective:   Vitals:   04/05/17 1420  BP: 121/67  Pulse: 98  Weight: 215 lb 8 oz (97.8 kg)    Fetal Status: Fetal Heart Rate (bpm): 140   Movement: Present     General:  Alert, oriented and cooperative. Patient is in no acute distress.  Skin: Skin is warm and dry. No rash noted.   Cardiovascular: Normal heart rate noted  Respiratory: Normal respiratory effort, no problems with respiration noted  Abdomen: Soft, gravid, appropriate for gestational age. Pain/Pressure: Present     Pelvic:  Cervical exam deferred        Extremities: Normal range of motion.  Edema: Trace  Mental Status: Normal mood and affect. Normal behavior. Normal judgment and thought content.   Urinalysis:      Assessment and Plan:  Pregnancy: [id number] at 8273w2d  1. Supervision of high risk pregnancy, antepartum Stable Cultures negative Labor precautions - Glucose Tolerance,  1 Hour  2. Obesity in pregnancy Glucola today  3. Unwanted fertility BTL papers signed  4. History of cesarean delivery TOLAC desired and papers signed  Term labor symptoms and general obstetric precautions including but not limited to vaginal bleeding, contractions, leaking of fluid and fetal movement were reviewed in detail with the patient. Please refer to After Visit Summary for other counseling recommendations.  Return in about 1 week (around 04/12/2017) for OB visit.   Hermina StaggersErvin, Dez Stauffer L, MD

## 2017-04-07 LAB — GLUCOSE TOLERANCE, 1 HOUR: Glucose, 1Hr PP: 151 mg/dL (ref 65–199)

## 2017-04-11 ENCOUNTER — Ambulatory Visit (INDEPENDENT_AMBULATORY_CARE_PROVIDER_SITE_OTHER): Payer: Medicaid Other | Admitting: Obstetrics and Gynecology

## 2017-04-11 VITALS — BP 112/67 | HR 72 | Wt 216.0 lb

## 2017-04-11 DIAGNOSIS — R7309 Other abnormal glucose: Secondary | ICD-10-CM | POA: Insufficient documentation

## 2017-04-11 DIAGNOSIS — R7302 Impaired glucose tolerance (oral): Secondary | ICD-10-CM

## 2017-04-11 DIAGNOSIS — Z98891 History of uterine scar from previous surgery: Secondary | ICD-10-CM

## 2017-04-11 DIAGNOSIS — O0993 Supervision of high risk pregnancy, unspecified, third trimester: Secondary | ICD-10-CM

## 2017-04-11 DIAGNOSIS — O0990 Supervision of high risk pregnancy, unspecified, unspecified trimester: Secondary | ICD-10-CM

## 2017-04-11 DIAGNOSIS — Z3009 Encounter for other general counseling and advice on contraception: Secondary | ICD-10-CM

## 2017-04-11 NOTE — Progress Notes (Signed)
Subjective:  Doris King is a 31 y.o. [id number] at 6139w1d being seen today for ongoing prenatal care.  She is currently monitored for the following issues for this high-risk pregnancy and has Cervical dysplasia; History of cesarean delivery; Severe obesity (BMI 35.0-39.9) (HCC); Obesity in pregnancy; History of incarceration; Marijuana abuse; Tobacco abuse; History of preterm delivery, currently pregnant in second trimester; Late prenatal care; Depression, anxiety, bipolar; History of pyelonephritis; Supervision of high risk pregnancy, antepartum; History of placenta abruption; GERD (gastroesophageal reflux disease); Unwanted fertility; and Glucose tolerance test abnormal on her problem list.  Patient reports no complaints.   .  .  Movement: Present. Denies leaking of fluid.   The following portions of the patient's history were reviewed and updated as appropriate: allergies, current medications, past family history, past medical history, past social history, past surgical history and problem list. Problem list updated.  Objective:   Vitals:   04/11/17 1503  BP: 112/67  Pulse: 72  Weight: 98 kg (216 lb)    Fetal Status: Fetal Heart Rate (bpm): 135   Movement: Present     General:  Alert, oriented and cooperative. Patient is in no acute distress.  Skin: Skin is warm and dry. No rash noted.   Cardiovascular: Normal heart rate noted  Respiratory: Normal respiratory effort, no problems with respiration noted  Abdomen: Soft, gravid, appropriate for gestational age. Pain/Pressure: Present     Pelvic:  Cervical exam performed        Extremities: Normal range of motion.  Edema: None  Mental Status: Normal mood and affect. Normal behavior. Normal judgment and thought content.   Urinalysis:      Assessment and Plan:  Pregnancy: [id number] at 4839w1d  1. Unwanted fertility BTL papers signed   2. Supervision of high risk pregnancy, antepartum Stable Labor precautions  3. History of cesarean  delivery TOLAC papers signed  4. Glucose tolerance test abnormal 3 hr GTT scheduled  Term labor symptoms and general obstetric precautions including but not limited to vaginal bleeding, contractions, leaking of fluid and fetal movement were reviewed in detail with the patient. Please refer to After Visit Summary for other counseling recommendations.  Return in about 1 week (around 04/18/2017) for OB visit.   Hermina StaggersErvin, Sahib Pella L, MD

## 2017-04-11 NOTE — Patient Instructions (Signed)
Vaginal Delivery Vaginal delivery means that you will give birth by pushing your baby out of your birth canal (vagina). A team of health care providers will help you before, during, and after vaginal delivery. Birth experiences are unique for every woman and every pregnancy, and birth experiences vary depending on where you choose to give birth. What should I do to prepare for my baby's birth? Before your baby is born, it is important to talk with your health care provider about:  Your labor and delivery preferences. These may include: ? Medicines that you may be given. ? How you will manage your pain. This might include non-medical pain relief techniques or injectable pain relief such as epidural analgesia. ? How you and your baby will be monitored during labor and delivery. ? Who may be in the labor and delivery room with you. ? Your feelings about surgical delivery of your baby (cesarean delivery, or C-section) if this becomes necessary. ? Your feelings about receiving donated blood through an IV tube (blood transfusion) if this becomes necessary.  Whether you are able: ? To take pictures or videos of the birth. ? To eat during labor and delivery. ? To move around, walk, or change positions during labor and delivery.  What to expect after your baby is born, such as: ? Whether delayed umbilical cord clamping and cutting is offered. ? Who will care for your baby right after birth. ? Medicines or tests that may be recommended for your baby. ? Whether breastfeeding is supported in your hospital or birth center. ? How long you will be in the hospital or birth center.  How any medical conditions you have may affect your baby or your labor and delivery experience.  To prepare for your baby's birth, you should also:  Attend all of your health care visits before delivery (prenatal visits) as recommended by your health care provider. This is important.  Prepare your home for your baby's  arrival. Make sure that you have: ? Diapers. ? Baby clothing. ? Feeding equipment. ? Safe sleeping arrangements for you and your baby.  Install a car seat in your vehicle. Have your car seat checked by a certified car seat installer to make sure that it is installed safely.  Think about who will help you with your new baby at home for at least the first several weeks after delivery.  What can I expect when I arrive at the birth center or hospital? Once you are in labor and have been admitted into the hospital or birth center, your health care provider may:  Review your pregnancy history and any concerns you have.  Insert an IV tube into one of your veins. This is used to give you fluids and medicines.  Check your blood pressure, pulse, temperature, and heart rate (vital signs).  Check whether your bag of water (amniotic sac) has broken (ruptured).  Talk with you about your birth plan and discuss pain control options.  Monitoring Your health care provider may monitor your contractions (uterine monitoring) and your baby's heart rate (fetal monitoring). You may need to be monitored:  Often, but not continuously (intermittently).  All the time or for long periods at a time (continuously). Continuous monitoring may be needed if: ? You are taking certain medicines, such as medicine to relieve pain or make your contractions stronger. ? You have pregnancy or labor complications.  Monitoring may be done by:  Placing a special stethoscope or a handheld monitoring device on your abdomen to   check your baby's heartbeat, and feeling your abdomen for contractions. This method of monitoring does not continuously record your baby's heartbeat or your contractions.  Placing monitors on your abdomen (external monitors) to record your baby's heartbeat and the frequency and length of contractions. You may not have to wear external monitors all the time.  Placing monitors inside of your uterus  (internal monitors) to record your baby's heartbeat and the frequency, length, and strength of your contractions. ? Your health care provider may use internal monitors if he or she needs more information about the strength of your contractions or your baby's heart rate. ? Internal monitors are put in place by passing a thin, flexible wire through your vagina and into your uterus. Depending on the type of monitor, it may remain in your uterus or on your baby's head until birth. ? Your health care provider will discuss the benefits and risks of internal monitoring with you and will ask for your permission before inserting the monitors.  Telemetry. This is a type of continuous monitoring that can be done with external or internal monitors. Instead of having to stay in bed, you are able to move around during telemetry. Ask your health care provider if telemetry is an option for you.  Physical exam Your health care provider may perform a physical exam. This may include:  Checking whether your baby is positioned: ? With the head toward your vagina (head-down). This is most common. ? With the head toward the top of your uterus (head-up or breech). If your baby is in a breech position, your health care provider may try to turn your baby to a head-down position so you can deliver vaginally. If it does not seem that your baby can be born vaginally, your provider may recommend surgery to deliver your baby. In rare cases, you may be able to deliver vaginally if your baby is head-up (breech delivery). ? Lying sideways (transverse). Babies that are lying sideways cannot be delivered vaginally.  Checking your cervix to determine: ? Whether it is thinning out (effacing). ? Whether it is opening up (dilating). ? How low your baby has moved into your birth canal.  What are the three stages of labor and delivery?  Normal labor and delivery is divided into the following three stages: Stage 1  Stage 1 is the  longest stage of labor, and it can last for hours or days. Stage 1 includes: ? Early labor. This is when contractions may be irregular, or regular and mild. Generally, early labor contractions are more than 10 minutes apart. ? Active labor. This is when contractions get longer, more regular, more frequent, and more intense. ? The transition phase. This is when contractions happen very close together, are very intense, and may last longer than during any other part of labor.  Contractions generally feel mild, infrequent, and irregular at first. They get stronger, more frequent (about every 2-3 minutes), and more regular as you progress from early labor through active labor and transition.  Many women progress through stage 1 naturally, but you may need help to continue making progress. If this happens, your health care provider may talk with you about: ? Rupturing your amniotic sac if it has not ruptured yet. ? Giving you medicine to help make your contractions stronger and more frequent.  Stage 1 ends when your cervix is completely dilated to 4 inches (10 cm) and completely effaced. This happens at the end of the transition phase. Stage 2  Once   your cervix is completely effaced and dilated to 4 inches (10 cm), you may start to feel an urge to push. It is common for the body to naturally take a rest before feeling the urge to push, especially if you received an epidural or certain other pain medicines. This rest period may last for up to 1-2 hours, depending on your unique labor experience.  During stage 2, contractions are generally less painful, because pushing helps relieve contraction pain. Instead of contraction pain, you may feel stretching and burning pain, especially when the widest part of your baby's head passes through the vaginal opening (crowning).  Your health care provider will closely monitor your pushing progress and your baby's progress through the vagina during stage 2.  Your  health care provider may massage the area of skin between your vaginal opening and anus (perineum) or apply warm compresses to your perineum. This helps it stretch as the baby's head starts to crown, which can help prevent perineal tearing. ? In some cases, an incision may be made in your perineum (episiotomy) to allow the baby to pass through the vaginal opening. An episiotomy helps to make the opening of the vagina larger to allow more room for the baby to fit through.  It is very important to breathe and focus so your health care provider can control the delivery of your baby's head. Your health care provider may have you decrease the intensity of your pushing, to help prevent perineal tearing.  After delivery of your baby's head, the shoulders and the rest of the body generally deliver very quickly and without difficulty.  Once your baby is delivered, the umbilical cord may be cut right away, or this may be delayed for 1-2 minutes, depending on your baby's health. This may vary among health care providers, hospitals, and birth centers.  If you and your baby are healthy enough, your baby may be placed on your chest or abdomen to help maintain the baby's temperature and to help you bond with each other. Some mothers and babies start breastfeeding at this time. Your health care team will dry your baby and help keep your baby warm during this time.  Your baby may need immediate care if he or she: ? Showed signs of distress during labor. ? Has a medical condition. ? Was born too early (prematurely). ? Had a bowel movement before birth (meconium). ? Shows signs of difficulty transitioning from being inside the uterus to being outside of the uterus. If you are planning to breastfeed, your health care team will help you begin a feeding. Stage 3  The third stage of labor starts immediately after the birth of your baby and ends after you deliver the placenta. The placenta is an organ that develops  during pregnancy to provide oxygen and nutrients to your baby in the womb.  Delivering the placenta may require some pushing, and you may have mild contractions. Breastfeeding can stimulate contractions to help you deliver the placenta.  After the placenta is delivered, your uterus should tighten (contract) and become firm. This helps to stop bleeding in your uterus. To help your uterus contract and to control bleeding, your health care provider may: ? Give you medicine by injection, through an IV tube, by mouth, or through your rectum (rectally). ? Massage your abdomen or perform a vaginal exam to remove any blood clots that are left in your uterus. ? Empty your bladder by placing a thin, flexible tube (catheter) into your bladder. ? Encourage   you to breastfeed your baby. After labor is over, you and your baby will be monitored closely to ensure that you are both healthy until you are ready to go home. Your health care team will teach you how to care for yourself and your baby. This information is not intended to replace advice given to you by your health care provider. Make sure you discuss any questions you have with your health care provider. Document Released: 04/27/2008 Document Revised: 02/06/2016 Document Reviewed: 08/03/2015 Elsevier Interactive Patient Education  2018 [employer]  

## 2017-04-13 ENCOUNTER — Inpatient Hospital Stay (HOSPITAL_COMMUNITY)
Admission: AD | Admit: 2017-04-13 | Discharge: 2017-04-13 | Disposition: A | Discharge status: Home / Self Care | Payer: Medicaid Other | Source: Ambulatory Visit | Attending: Obstetrics and Gynecology | Admitting: Obstetrics and Gynecology

## 2017-04-13 ENCOUNTER — Encounter (HOSPITAL_COMMUNITY): Payer: Self-pay

## 2017-04-13 DIAGNOSIS — O9989 Other specified diseases and conditions complicating pregnancy, childbirth and the puerperium: Secondary | ICD-10-CM

## 2017-04-13 DIAGNOSIS — Z3A37 37 weeks gestation of pregnancy: Secondary | ICD-10-CM | POA: Diagnosis not present

## 2017-04-13 DIAGNOSIS — F319 Bipolar disorder, unspecified: Secondary | ICD-10-CM | POA: Diagnosis not present

## 2017-04-13 DIAGNOSIS — O26893 Other specified pregnancy related conditions, third trimester: Secondary | ICD-10-CM | POA: Insufficient documentation

## 2017-04-13 DIAGNOSIS — O99891 Other specified diseases and conditions complicating pregnancy: Secondary | ICD-10-CM

## 2017-04-13 DIAGNOSIS — Z79899 Other long term (current) drug therapy: Secondary | ICD-10-CM | POA: Diagnosis not present

## 2017-04-13 DIAGNOSIS — Z3689 Encounter for other specified antenatal screening: Secondary | ICD-10-CM

## 2017-04-13 DIAGNOSIS — M545 Low back pain: Secondary | ICD-10-CM | POA: Diagnosis not present

## 2017-04-13 DIAGNOSIS — M549 Dorsalgia, unspecified: Secondary | ICD-10-CM

## 2017-04-13 DIAGNOSIS — Z87891 Personal history of nicotine dependence: Secondary | ICD-10-CM | POA: Diagnosis not present

## 2017-04-13 DIAGNOSIS — B9689 Other specified bacterial agents as the cause of diseases classified elsewhere: Secondary | ICD-10-CM | POA: Diagnosis not present

## 2017-04-13 DIAGNOSIS — N760 Acute vaginitis: Secondary | ICD-10-CM

## 2017-04-13 DIAGNOSIS — O99343 Other mental disorders complicating pregnancy, third trimester: Secondary | ICD-10-CM | POA: Diagnosis not present

## 2017-04-13 DIAGNOSIS — F419 Anxiety disorder, unspecified: Secondary | ICD-10-CM | POA: Diagnosis not present

## 2017-04-13 DIAGNOSIS — O34219 Maternal care for unspecified type scar from previous cesarean delivery: Secondary | ICD-10-CM | POA: Diagnosis not present

## 2017-04-13 LAB — URINALYSIS, ROUTINE W REFLEX MICROSCOPIC
BILIRUBIN URINE: NEGATIVE
GLUCOSE, UA: NEGATIVE mg/dL
KETONES UR: NEGATIVE mg/dL
NITRITE: NEGATIVE
PH: 7 (ref 5.0–8.0)
PROTEIN: NEGATIVE mg/dL
Specific Gravity, Urine: 1.003 — ABNORMAL LOW (ref 1.005–1.030)

## 2017-04-13 LAB — WET PREP, GENITAL
Sperm: NONE SEEN
Trich, Wet Prep: NONE SEEN
Yeast Wet Prep HPF POC: NONE SEEN

## 2017-04-13 MED ORDER — CYCLOBENZAPRINE HCL 10 MG PO TABS: 10.0000 mg | ORAL_TABLET | Freq: Three times a day (TID) | ORAL | 0 refills | Status: DC | PRN

## 2017-04-13 MED ORDER — CYCLOBENZAPRINE HCL 10 MG PO TABS
10.0000 mg | ORAL_TABLET | Freq: Three times a day (TID) | ORAL | Status: DC | PRN
  Administered 2017-04-13: 10 mg via ORAL
  Filled 2017-04-13: qty 1

## 2017-04-13 MED ORDER — METRONIDAZOLE 500 MG PO TABS: 500.0000 mg | ORAL_TABLET | Freq: Two times a day (BID) | ORAL | 0 refills | Status: DC

## 2017-04-13 NOTE — MAU Provider Note (Signed)
History     CSN: [id number]  Arrival date and time: 04/13/17 2209   First Provider Initiated Contact with Patient 04/13/17 2253      Chief Complaint  Patient presents with  . Back Pain   [id number] @37 .3 weeks here with low back pain and vaginal discharge. Back pain started 2 hrs ago. Describes as constant in lower central back. Denies lifting/pushing/pulling or recent injury. Vaginal discharge is thin, white, and malodorous x2 weeks. Denies VB or LOF. Irreg ctx present. Good FM.    OB History    Gravida Para Term Preterm AB Living   8 6 4 2 1 5    SAB TAB Ectopic Multiple Live Births   0 1 0 0 5      Obstetric Comments   G1: 2006. TSVD 6lbs 3oz. No issues G2: 2007. TSVD. 6lbs 3oz. Multiple pyelo admits G3: 2010. PPROM at 35-36wks. SVD. 5lbs 8oz G4: 2011. EAB at 18wks  G5: 2004. TSVD 6lbs 3oz G6: 2013 urgent c/s for what sounds like abruption. Term. Pt never told she  couldn't labor in the future. 6lbs G7: 2017 SVD. Pt was incarcerated and felt like she had to go to bathroom and then delivered. Didn't have to go to hospital. She states [facility] "[facility]acenta "      Past Medical History:  Diagnosis Date  . Anemia   . Anxiety   . Bipolar affective disorder (HCC)   . Depression     Past Surgical History:  Procedure Laterality Date  . CESAREAN SECTION  2016  . DILATION AND CURETTAGE OF UTERUS     EAB at 18wks    No family history on file.  Social History  Substance Use Topics  . Smoking status: Former Smoker    Types: Cigarettes    Quit date: 10/06/2016  . Smokeless tobacco: Never Used  . Alcohol use No    Allergies: No Known Allergies  Prescriptions Prior to Admission  Medication Sig Dispense Refill Last Dose  . acetaminophen (TYLENOL) 325 MG tablet Take 650 mg by mouth every 6 (six) hours as needed.   04/13/2017 at Unknown time  . pantoprazole (PROTONIX) 40 MG tablet Take 1 tablet (40 mg total) by mouth  daily. 30 tablet 1 04/13/2017 at 1930  . Prenatal Vit-Fe Fumarate-FA (PRENATAL MULTIVITAMIN) TABS tablet Take 1 tablet by mouth daily at 12 noon.   04/13/2017 at Unknown time  . Elastic Bandages & Supports (COMFORT FIT MATERNITY SUPP MED) MISC Wear daily when ambulating (Patient not taking: Reported on 04/05/2017) 1 each 0 Not Taking  . famotidine (PEPCID) 20 MG tablet Take 1 tablet (20 mg total) by mouth 2 (two) times daily. 60 tablet 3 Taking    Review of Systems  Constitutional: Negative for fever.  Gastrointestinal: Negative for abdominal pain.  Genitourinary: Positive for vaginal discharge. Negative for dysuria, hematuria, urgency and vaginal bleeding.  Musculoskeletal: Positive for back pain.   Physical Exam   Blood pressure 120/67, pulse 91, temperature 98.5 F (36.9 C), temperature source Oral, resp. rate (!) 21, last menstrual period 07/25/2016.  Physical Exam  Constitutional: She is oriented to person, place, and time. She appears well-developed and well-nourished. No distress.  HENT:  Head: Normocephalic and atraumatic.  Neck: Normal range of motion.  Cardiovascular: Normal rate.   Respiratory: Effort normal.  GI: Soft. She exhibits no distension. There is no tenderness.  gravid  Genitourinary:  Genitourinary Comments: SVE  2cm/30% per RN exam  Musculoskeletal: Normal range of motion.  Neurological: She is alert and oriented to person, place, and time.  Skin: Skin is warm and dry.  Psychiatric: She has a normal mood and affect.  EFM: 135 bpm, mod variability, + accels, no decels Toco: none  Results for orders placed or performed during the hospital encounter of 04/13/17 (from the past 24 hour(s))  Urinalysis, Routine w reflex microscopic     Status: Abnormal   Collection Time: 04/13/17 10:15 PM  Result Value Ref Range   Color, Urine STRAW (A) YELLOW   APPearance CLEAR CLEAR   Specific Gravity, Urine 1.003 (L) 1.005 - 1.030   pH 7.0 5.0 - 8.0   Glucose, UA NEGATIVE  NEGATIVE mg/dL   Hgb urine dipstick SMALL (A) NEGATIVE   Bilirubin Urine NEGATIVE NEGATIVE   Ketones, ur NEGATIVE NEGATIVE mg/dL   Protein, ur NEGATIVE NEGATIVE mg/dL   Nitrite NEGATIVE NEGATIVE   Leukocytes, UA TRACE (A) NEGATIVE   RBC / HPF 0-5 0 - 5 RBC/hpf   WBC, UA 0-5 0 - 5 WBC/hpf   Bacteria, UA RARE (A) NONE SEEN   Squamous Epithelial / LPF 0-5 (A) NONE SEEN   MAU Course  Procedures Flexeril Heating pad  MDM Labs ordered and reviewed. No evidence of UTI. Pain likely MSK. Pain resolved after heat and Flexeril. No evidence of UTI or labor. Will treat BV. Stable for discharge home.  Assessment and Plan   1. [redacted] weeks gestation of pregnancy   2. NST (non-stress test) reactive   3. Back pain affecting pregnancy in third trimester   4. Bacterial vaginosis    Discharge home Follow up in OB office as scheduled Labor precautions Rx Flexeril Rx Flagyl  Allergies as of 04/13/2017   No Known Allergies     Medication List    TAKE these medications   acetaminophen 325 MG tablet Commonly known as:  TYLENOL Take 650 mg by mouth every 6 (six) hours as needed.   COMFORT FIT MATERNITY SUPP MED Misc Wear daily when ambulating   cyclobenzaprine 10 MG tablet Commonly known as:  FLEXERIL Take 1 tablet (10 mg total) by mouth every 8 (eight) hours as needed for muscle spasms.   famotidine 20 MG tablet Commonly known as:  PEPCID Take 1 tablet (20 mg total) by mouth 2 (two) times daily.   metroNIDAZOLE 500 MG tablet Commonly known as:  FLAGYL Take 1 tablet (500 mg total) by mouth 2 (two) times daily.   pantoprazole 40 MG tablet Commonly known as:  PROTONIX Take 1 tablet (40 mg total) by mouth daily.   prenatal multivitamin Tabs tablet Take 1 tablet by mouth daily at 12 noon.            Discharge Care Instructions        Start     Ordered   04/13/17 0000  Discharge patient    Question Answer Comment  Discharge disposition 01-Home or Self Care   Discharge  patient date 04/13/2017      04/13/17 2345   04/13/17 0000  metroNIDAZOLE (FLAGYL) 500 MG tablet  2 times daily    Question:  Supervising Provider  Answer:  Tilda Burrow   04/13/17 2345   04/13/17 0000  cyclobenzaprine (FLEXERIL) 10 MG tablet  Every 8 hours PRN    Question:  Supervising Provider  Answer:  Tilda Burrow   04/13/17 2347     Doris King, CNM 04/13/2017, 10:56 PM

## 2017-04-13 NOTE — MAU Note (Signed)
Lower back pain that is constant started 2 hours ago.  Tried tylenol a few hours ago-no relief.  Sudden onset.  No LOF or VB.  + FM.  Last VE 2 cm/50% last week.  Having increased discharge with foul odor.

## 2017-04-13 NOTE — Discharge Instructions (Signed)
Back Pain in Pregnancy Back pain during pregnancy is common. Back pain may be caused by several factors that are related to changes during your pregnancy. Follow these instructions at home: Managing pain, stiffness, and swelling  If directed, apply ice for sudden (acute) back pain. ? Put ice in a plastic bag. ? Place a towel between your skin and the bag. ? Leave the ice on for 20 minutes, 2-3 times per day.  If directed, apply heat to the affected area before you exercise: ? Place a towel between your skin and the heat pack or heating pad. ? Leave the heat on for 20-30 minutes. ? Remove the heat if your skin turns bright red. This is especially important if you are unable to feel pain, heat, or cold. You may have a greater risk of getting burned. Activity  Exercise as told by your health care provider. Exercising is the best way to prevent or manage back pain.  Listen to your body when lifting. If lifting hurts, ask for help or bend your knees. This uses your leg muscles instead of your back muscles.  Squat down when picking up something from the floor. Do not bend over.  Only use bed rest as told by your health care provider. Bed rest should only be used for the most severe episodes of back pain. Standing, Sitting, and Lying Down  Do not stand in one place for long periods of time.  Use good posture when sitting. Make sure your head rests over your shoulders and is not hanging forward. Use a pillow on your lower back if necessary.  Try sleeping on your side, preferably the left side, with a pillow or two between your legs. If you are sore after a night's rest, your bed may be too soft. A firm mattress may provide more support for your back during pregnancy. General instructions  Do not wear high heels.  Eat a healthy diet. Try to gain weight within your health care provider's recommendations.  Use a maternity girdle, elastic sling, or back brace as told by your health care  provider.  Take over-the-counter and prescription medicines only as told by your health care provider.  Keep all follow-up visits as told by your health care provider. This is important. This includes any visits with any specialists, such as a physical therapist. Contact a health care provider if:  Your back pain interferes with your daily activities.  You have increasing pain in other parts of your body. Get help right away if:  You develop numbness, tingling, weakness, or problems with the use of your arms or legs.  You develop severe back pain that is not controlled with medicine.  You have a sudden change in bowel or bladder control.  You develop shortness of breath, dizziness, or you faint.  You develop nausea, vomiting, or sweating.  You have back pain that is a rhythmic, cramping pain similar to labor pains. Labor pain is usually 1-2 minutes apart, lasts for about 1 minute, and involves a bearing down feeling or pressure in your pelvis.  You have back pain and your water breaks or you have vaginal bleeding.  You have back pain or numbness that travels down your leg.  Your back pain developed after you fell.  You develop pain on one side of your back.  You see blood in your urine.  You develop skin blisters in the area of your back pain. This information is not intended to replace advice given to you   by your health care provider. Make sure you discuss any questions you have with your health care provider. Document Released: 10/27/2005 Document Revised: 12/25/2015 Document Reviewed: 04/02/2015 Elsevier Interactive Patient Education  2018 Else[employer] Inc[employer] Bacterial Vaginosis Bacterial vaginosis is an infection of the vagina. It happens when too many germs (bacteria) grow in the vagina. This infection puts you at risk for infections from sex (STIs). Treating this infection can lower your risk for some STIs. You should also treat this if you are pregnant. It can cause your baby  to be born early. Follow these instructions at home: Medicines  Take over-the-counter and prescription medicines only as told by your doctor.  Take or use your antibiotic medicine as told by your doctor. Do not stop taking or using it even if you start to feel better. General instructions  If you your sexual partner is a woman, tell her that you have this infection. She needs to get treatment if she has symptoms. If you have a female partner, he does not need to be treated.  During treatment: ? Avoid sex. ? Do not douche. ? Avoid alcohol as told. ? Avoid breastfeeding as told.  Drink enough fluid to keep your pee (urine) clear or pale yellow.  Keep your vagina and butt (rectum) clean. ? Wash the area with warm water every day. ? Wipe from front to back after you use the toilet.  Keep all follow-up visits as told by your doctor. This is important. Preventing this condition  Do not douche.  Use only warm water to wash around your vagina.  Use protection when you have sex. This includes: ? Latex condoms. ? Dental dams.  Limit how many people you have sex with. It is best to only have sex with the same person (be monogamous).  Get tested for STIs. Have your partner get tested.  Wear underwear that is cotton or lined with cotton.  Avoid tight pants and pantyhose. This is most important in summer.  Do not use any products that have nicotine or tobacco in them. These include cigarettes and e-cigarettes. If you need help quitting, ask your doctor.  Do not use illegal drugs.  Limit how much alcohol you drink. Contact a doctor if:  Your symptoms do not get better, even after you are treated.  You have more discharge or pain when you pee (urinate).  You have a fever.  You have pain in your belly (abdomen).  You have pain with sex.  Your bleed from your vagina between periods. Summary  This infection happens when too many germs (bacteria) grow in the  vagina.  Treating this condition can lower your risk for some infections from sex (STIs).  You should also treat this if you are pregnant. It can cause early (premature) birth.  Do not stop taking or using your antibiotic medicine even if you start to feel better. This information is not intended to replace advice given to you by your health care provider. Make sure you discuss any questions you have with your health care provider. Document Released: 04/27/2008 Document Revised: 04/03/2016 Document Reviewed: 04/03/2016 Elsevier Interactive Patient Education  2017 [employer] Inc[employer] of the uterus can occur throughout pregnancy, but they are not always a sign that you are in labor. You may have practice contractions called Braxton Hicks contractions. These false labor contractions are sometimes confused with true labor. What are Deberah Pelton contractions? Braxton Hicks contractions are tightening movements that occur in the muscles of  the uterus before labor. Unlike true labor contractions, these contractions do not result in opening (dilation) and thinning of the cervix. Toward the end of pregnancy (32-34 weeks), Braxton Hicks contractions can happen more often and may become stronger. These contractions are sometimes difficult to tell apart from true labor because they can be very uncomfortable. You should not feel embarrassed if you go to the hospital with false labor. Sometimes, the only way to tell if you are in true labor is for your health care provider to look for changes in the cervix. The health care provider will do a physical exam and may monitor your contractions. If you are not in true labor, the exam should show that your cervix is not dilating and your water has not broken. If there are no prenatal problems or other health problems associated with your pregnancy, it is completely safe for you to be sent home with false labor. You may continue to have  Braxton Hicks contractions until you go into true labor. How can I tell the difference between true labor and false labor?  Differences ? False labor ? Contractions last 30-70 seconds.: Contractions are usually shorter and not as strong as true labor contractions. ? Contractions become very regular.: Contractions are usually irregular. ? Discomfort is usually felt in the top of the uterus, and it spreads to the lower abdomen and low back.: Contractions are often felt in the front of the lower abdomen and in the groin. ? Contractions do not go away with walking.: Contractions may go away when you walk around or change positions while lying down. ? Contractions usually become more intense and increase in frequency.: Contractions get weaker and are shorter-lasting as time goes on. ? The cervix dilates and gets thinner.: The cervix usually does not dilate or become thin. Follow these instructions at home:  Take over-the-counter and prescription medicines only as told by your health care provider.  Keep up with your usual exercises and follow other instructions from your health care provider.  Eat and drink lightly if you think you are going into labor.  If Braxton Hicks contractions are making you uncomfortable: ? Change your position from lying down or resting to walking, or change from walking to resting. ? Sit and rest in a tub of warm water. ? Drink enough fluid to keep your urine clear or pale yellow. Dehydration may cause these contractions. ? Do slow and deep breathing several times an hour.  Keep all follow-up prenatal visits as told by your health care provider. This is important. Contact a health care provider if:  You have a fever.  You have continuous pain in your abdomen. Get help right away if:  Your contractions become stronger, more regular, and closer together.  You have fluid leaking or gushing from your vagina.  You pass blood-tinged mucus (bloody show).  You  have bleeding from your vagina.  You have low back pain that you never had before.  You feel your babys head pushing down and causing pelvic pressure.  Your baby is not moving inside you as much as it used to. Summary  Contractions that occur before labor are called Braxton Hicks contractions, false labor, or practice contractions.  Braxton Hicks contractions are usually shorter, weaker, farther apart, and less regular than true labor contractions. True labor contractions usually become progressively stronger and regular and they become more frequent.  Manage discomfort from [facility] contractions by changing position, resting in a warm bath, drinking plenty of  water, or practicing deep breathing. This information is not intended to replace advice given to you by your health care provider. Make sure you discuss any questions you have with your health care provider. Document Released: 07/19/2005 Document Revised: 06/07/2016 Document Reviewed: 06/07/2016 Elsevier Interactive Patient Education  2017 [employer].

## 2017-04-14 ENCOUNTER — Other Ambulatory Visit: Payer: Medicaid Other

## 2017-04-18 ENCOUNTER — Ambulatory Visit (INDEPENDENT_AMBULATORY_CARE_PROVIDER_SITE_OTHER): Payer: Medicaid Other | Admitting: Obstetrics & Gynecology

## 2017-04-18 VITALS — BP 126/64 | HR 90 | Wt 215.6 lb

## 2017-04-18 DIAGNOSIS — O0993 Supervision of high risk pregnancy, unspecified, third trimester: Secondary | ICD-10-CM

## 2017-04-18 DIAGNOSIS — Z23 Encounter for immunization: Secondary | ICD-10-CM | POA: Diagnosis not present

## 2017-04-18 DIAGNOSIS — Z98891 History of uterine scar from previous surgery: Secondary | ICD-10-CM

## 2017-04-18 DIAGNOSIS — O34219 Maternal care for unspecified type scar from previous cesarean delivery: Secondary | ICD-10-CM | POA: Diagnosis not present

## 2017-04-18 DIAGNOSIS — O0990 Supervision of high risk pregnancy, unspecified, unspecified trimester: Secondary | ICD-10-CM

## 2017-04-18 NOTE — Progress Notes (Signed)
   PRENATAL VISIT NOTE  Subjective:  Doris King is a 31 y.o. [id number] at [redacted]w[redacted]d being seen today for ongoing prenatal care.  She is currently monitored for the following issues for this low-risk pregnancy and has Cervical dysplasia; History of cesarean delivery; Severe obesity (BMI 35.0-39.9) (HCC); Obesity in pregnancy; History of incarceration; Marijuana abuse; Tobacco abuse; History of preterm delivery, currently pregnant in second trimester; Late prenatal care; Depression, anxiety, bipolar; History of pyelonephritis; Supervision of high risk pregnancy, antepartum; History of placenta abruption; GERD (gastroesophageal reflux disease); Unwanted fertility; and Glucose tolerance test abnormal on her problem list.  Patient reports occasional contractions.  Contractions: Irregular. Vag. Bleeding: None.  Movement: Present. Denies leaking of fluid.   The following portions of the patient's history were reviewed and updated as appropriate: allergies, current medications, past family history, past medical history, past social history, past surgical history and problem list. Problem list updated.  Objective:   Vitals:   04/18/17 1530  BP: 126/64  Pulse: 90  Weight: 215 lb 9.6 oz (97.8 kg)    Fetal Status: Fetal Heart Rate (bpm): 140 Fundal Height: 37 cm Movement: Present     General:  Alert, oriented and cooperative. Patient is in no acute distress.  Skin: Skin is warm and dry. No rash noted.   Cardiovascular: Normal heart rate noted  Respiratory: Normal respiratory effort, no problems with respiration noted  Abdomen: Soft, gravid, appropriate for gestational age.  Pain/Pressure: Present     Pelvic: Cervical exam performed        Extremities: Normal range of motion.  Edema: Mild pitting, slight indentation  Mental Status:  Normal mood and affect. Normal behavior. Normal judgment and thought content.   Assessment and Plan:  Pregnancy: [id number] at [redacted]w[redacted]d  1. Supervision of high risk  pregnancy, antepartum  - Flu Vaccine QUAD 36+ mos IM  2. History of cesarean delivery H/o VBAC and plans TOLAC  Term labor symptoms and general obstetric precautions including but not limited to vaginal bleeding, contractions, leaking of fluid and fetal movement were reviewed in detail with the patient. Please refer to After Visit Summary for other counseling recommendations.  Return in about 1 week (around 04/25/2017).   Scheryl Darter, MD

## 2017-04-18 NOTE — Patient Instructions (Signed)
Vaginal Birth After Cesarean Delivery Vaginal birth after cesarean delivery (VBAC) is giving birth vaginally after previously delivering a baby by a cesarean. In the past, if a woman had a cesarean delivery, all births afterward would be done by cesarean delivery. This is no longer true. It can be safe for the mother to try a vaginal delivery after having a cesarean delivery. It is important to discuss VBAC with your health care provider early in the pregnancy so you can understand the risks, benefits, and options. It will give you time to decide what is best in your particular case. The final decision about whether to have a VBAC or repeat cesarean delivery should be between you and your health care provider. Any changes in your health or your baby's health during your pregnancy may make it necessary to change your initial decision about VBAC. Women who plan to have a VBAC should check with their health care provider to be sure that:  The previous cesarean delivery was done with a low transverse uterine cut (incision) (not a vertical classical incision).  The birth canal is big enough for the baby.  There were no other operations on the uterus.  An electronic fetal monitor (EFM) will be on at all times during labor.  An operating room will be available and ready in case an emergency cesarean delivery is needed.  A health care provider and surgical nursing staff will be available at all times during labor to be ready to do an emergency delivery cesarean if necessary.  An anesthesiologist will be present in case an emergency cesarean delivery is needed.  The nursery is prepared and has adequate personnel and necessary equipment available to care for the baby in case of an emergency cesarean delivery. Benefits of VBAC  Shorter stay in the hospital.  Avoidance of risks associated with cesarean delivery, such as: ? Surgical complications, such as opening of the incision or hernia in the  incision. ? Injury to other organs. ? Fever. This can occur if an infection develops after surgery. It can also occur as a reaction to the medicine given to make you numb during the surgery.  Less blood loss and need for blood transfusions.  Lower risk of blood clots and infection.  Shorter recovery.  Decreased risk for having to remove the uterus (hysterectomy).  Decreased risk for the placenta to completely or partially cover the opening of the uterus (placenta previa) with a future pregnancy.  Decrease risk in future labor and delivery. Risks of a VBAC  Tearing (rupture) of the uterus. This is occurs in less than 1% of VBACs. The risk of this happening is higher if: ? Steps are taken to begin the labor process (induce labor) or stimulate or strengthen contractions (augment labor). ? Medicine is used to soften (ripen) the cervix.  Having to remove the uterus (hysterectomy) if it ruptures. VBAC should not be done if:  The previous cesarean delivery was done with a vertical (classical) or T-shaped incision or you do not know what kind of incision was made.  You had a ruptured uterus.  You have had certain types of surgery on your uterus, such as removal of uterine fibroids. Ask your health care provider about other types of surgeries that prevent you from having a VBAC.  You have certain medical or childbirth (obstetrical) problems.  There are problems with the baby.  You have had two previous cesarean deliveries and no vaginal deliveries. Other facts to know about VBAC:  It   is safe to have an epidural anesthetic with VBAC.  It is safe to turn the baby from a breech position (attempt an external cephalic version).  It is safe to try a VBAC with twins.  VBAC may not be successful if your baby weights 8.8 lb (4 kg) or more. However, weight predictions are not always accurate and should not be used alone to decide if VBAC is right for you.  There is an increased failure rate  if the time between the cesarean delivery and VBAC is less than 19 months.  Your health care provider may advise against a VBAC if you have preeclampsia (high blood pressure, protein in the urine, and swelling of face and extremities).  VBAC is often successful if you previously gave birth vaginally.  VBAC is often successful when the labor starts spontaneously before the due date.  Delivering a baby through a VBAC is similar to having a normal spontaneous vaginal delivery. This information is not intended to replace advice given to you by your health care provider. Make sure you discuss any questions you have with your health care provider. Document Released: 01/09/2007 Document Revised: 12/25/2015 Document Reviewed: 02/15/2013 Elsevier Interactive Patient Education  2018 Elsevier Inc.  

## 2017-04-25 ENCOUNTER — Other Ambulatory Visit: Payer: Self-pay | Admitting: Obstetrics and Gynecology

## 2017-04-25 ENCOUNTER — Ambulatory Visit (INDEPENDENT_AMBULATORY_CARE_PROVIDER_SITE_OTHER): Payer: Medicaid Other | Admitting: Family Medicine

## 2017-04-25 VITALS — BP 122/66 | HR 89 | Wt 215.9 lb

## 2017-04-25 DIAGNOSIS — O09213 Supervision of pregnancy with history of pre-term labor, third trimester: Secondary | ICD-10-CM

## 2017-04-25 DIAGNOSIS — Z98891 History of uterine scar from previous surgery: Secondary | ICD-10-CM

## 2017-04-25 DIAGNOSIS — O09212 Supervision of pregnancy with history of pre-term labor, second trimester: Secondary | ICD-10-CM

## 2017-04-25 DIAGNOSIS — O09892 Supervision of other high risk pregnancies, second trimester: Secondary | ICD-10-CM

## 2017-04-25 DIAGNOSIS — O0990 Supervision of high risk pregnancy, unspecified, unspecified trimester: Secondary | ICD-10-CM

## 2017-04-25 DIAGNOSIS — O0993 Supervision of high risk pregnancy, unspecified, third trimester: Secondary | ICD-10-CM

## 2017-04-25 DIAGNOSIS — O34219 Maternal care for unspecified type scar from previous cesarean delivery: Secondary | ICD-10-CM

## 2017-04-25 DIAGNOSIS — Z3009 Encounter for other general counseling and advice on contraception: Secondary | ICD-10-CM

## 2017-04-25 DIAGNOSIS — R7302 Impaired glucose tolerance (oral): Secondary | ICD-10-CM

## 2017-04-25 DIAGNOSIS — R7309 Other abnormal glucose: Secondary | ICD-10-CM

## 2017-04-25 LAB — GLUCOSE, CAPILLARY: Glucose-Capillary: 107 mg/dL — ABNORMAL HIGH (ref 65–99)

## 2017-04-25 NOTE — Progress Notes (Signed)
   PRENATAL VISIT NOTE  Subjective:  Doris King is a 31 y.o. [id number] at [redacted]w[redacted]d being seen today for ongoing prenatal care.  She is currently monitored for the following issues for this high-risk pregnancy and has Cervical dysplasia; History of cesarean delivery; Severe obesity (BMI 35.0-39.9) (HCC); Obesity in pregnancy; History of incarceration; Marijuana abuse; Tobacco abuse; History of preterm delivery, currently pregnant in second trimester; Late prenatal care; Depression, anxiety, bipolar; History of pyelonephritis; Supervision of high risk pregnancy, antepartum; History of placenta abruption; GERD (gastroesophageal reflux disease); Unwanted fertility; and Glucose tolerance test abnormal on her problem list.  Patient reports pelvic pain.  Contractions: Irregular. Vag. Bleeding: None.  Movement: Present. Denies leaking of fluid.   The following portions of the patient's history were reviewed and updated as appropriate: allergies, current medications, past family history, past medical history, past social history, past surgical history and problem list. Problem list updated.  Objective:   Vitals:   04/25/17 1443  BP: 122/66  Pulse: 89  Weight: 215 lb 14.4 oz (97.9 kg)    Fetal Status: Fetal Heart Rate (bpm): 143 Fundal Height: 39 cm Movement: Present  Presentation: Vertex  General:  Alert, oriented and cooperative. Patient is in no acute distress.  Skin: Skin is warm and dry. No rash noted.   Cardiovascular: Normal heart rate noted  Respiratory: Normal respiratory effort, no problems with respiration noted  Abdomen: Soft, gravid, appropriate for gestational age.  Pain/Pressure: Present     Pelvic: Cervical exam deferred Dilation: 2.5 Effacement (%): 50 Station: -2  Extremities: Normal range of motion.  Edema: Trace  Mental Status:  Normal mood and affect. Normal behavior. Normal judgment and thought content.   Assessment and Plan:  Pregnancy: [id number] at [redacted]w[redacted]d  1. Supervision of  high risk pregnancy, antepartum FHT and FH normal  2. History of cesarean delivery TOLAC  3. Unwanted fertility BTL   4. Glucose tolerance test abnormal Patient did not come to 3hr GTT. 1hr GTT elevated at 151. Random CBG 107 (roughly 2hrs after eating). Will treat as diet controlled GDM and induce at 40wks.  5. History of preterm delivery, currently pregnant in second trimester  6. Severe obesity (BMI 35.0-39.9) (HCC)  Term labor symptoms and general obstetric precautions including but not limited to vaginal bleeding, contractions, leaking of fluid and fetal movement were reviewed in detail with the patient. Please refer to After Visit Summary for other counseling recommendations.  Return in about 4 weeks (around 05/23/2017) for Postpartum Exam.   Levie Heritage, DO

## 2017-04-26 ENCOUNTER — Encounter (HOSPITAL_COMMUNITY): Payer: Self-pay | Admitting: *Deleted

## 2017-04-26 ENCOUNTER — Telehealth (HOSPITAL_COMMUNITY): Payer: Self-pay | Admitting: *Deleted

## 2017-04-26 NOTE — Telephone Encounter (Signed)
Preadmission screen  

## 2017-05-01 ENCOUNTER — Inpatient Hospital Stay (HOSPITAL_COMMUNITY): Payer: Medicaid Other | Admitting: Anesthesiology

## 2017-05-01 ENCOUNTER — Encounter (HOSPITAL_COMMUNITY): Payer: Self-pay

## 2017-05-01 ENCOUNTER — Encounter (HOSPITAL_COMMUNITY)
Admission: RE | Disposition: A | Discharge status: Home / Self Care | Payer: Self-pay | Source: Ambulatory Visit | Attending: Family Medicine

## 2017-05-01 ENCOUNTER — Inpatient Hospital Stay (HOSPITAL_COMMUNITY)
Admission: RE | Admit: 2017-05-01 | Discharge: 2017-05-02 | DRG: 797 | Disposition: A | Discharge status: Home / Self Care | Payer: Medicaid Other | Source: Ambulatory Visit | Attending: Family Medicine | Admitting: Family Medicine

## 2017-05-01 DIAGNOSIS — F419 Anxiety disorder, unspecified: Secondary | ICD-10-CM | POA: Diagnosis present

## 2017-05-01 DIAGNOSIS — O99324 Drug use complicating childbirth: Secondary | ICD-10-CM | POA: Diagnosis present

## 2017-05-01 DIAGNOSIS — K219 Gastro-esophageal reflux disease without esophagitis: Secondary | ICD-10-CM | POA: Diagnosis present

## 2017-05-01 DIAGNOSIS — O99214 Obesity complicating childbirth: Secondary | ICD-10-CM | POA: Diagnosis present

## 2017-05-01 DIAGNOSIS — O24420 Gestational diabetes mellitus in childbirth, diet controlled: Secondary | ICD-10-CM | POA: Diagnosis present

## 2017-05-01 DIAGNOSIS — Z3A40 40 weeks gestation of pregnancy: Secondary | ICD-10-CM

## 2017-05-01 DIAGNOSIS — Z6836 Body mass index (BMI) 36.0-36.9, adult: Secondary | ICD-10-CM | POA: Diagnosis not present

## 2017-05-01 DIAGNOSIS — O9962 Diseases of the digestive system complicating childbirth: Secondary | ICD-10-CM | POA: Diagnosis present

## 2017-05-01 DIAGNOSIS — F1290 Cannabis use, unspecified, uncomplicated: Secondary | ICD-10-CM | POA: Diagnosis present

## 2017-05-01 DIAGNOSIS — Z302 Encounter for sterilization: Secondary | ICD-10-CM

## 2017-05-01 DIAGNOSIS — O99344 Other mental disorders complicating childbirth: Secondary | ICD-10-CM | POA: Diagnosis present

## 2017-05-01 DIAGNOSIS — O24429 Gestational diabetes mellitus in childbirth, unspecified control: Secondary | ICD-10-CM | POA: Diagnosis present

## 2017-05-01 DIAGNOSIS — Z87891 Personal history of nicotine dependence: Secondary | ICD-10-CM | POA: Diagnosis not present

## 2017-05-01 DIAGNOSIS — F329 Major depressive disorder, single episode, unspecified: Secondary | ICD-10-CM | POA: Diagnosis present

## 2017-05-01 DIAGNOSIS — O24410 Gestational diabetes mellitus in pregnancy, diet controlled: Secondary | ICD-10-CM | POA: Diagnosis present

## 2017-05-01 HISTORY — PX: TUBAL LIGATION: SHX77

## 2017-05-01 LAB — CBC
HEMATOCRIT: 36.1 % (ref 36.0–46.0)
Hemoglobin: 11.9 g/dL — ABNORMAL LOW (ref 12.0–15.0)
MCH: 27.2 pg (ref 26.0–34.0)
MCHC: 33 g/dL (ref 30.0–36.0)
MCV: 82.6 fL (ref 78.0–100.0)
Platelets: 230 10*3/uL (ref 150–400)
RBC: 4.37 MIL/uL (ref 3.87–5.11)
RDW: 15.3 % (ref 11.5–15.5)
WBC: 8.7 10*3/uL (ref 4.0–10.5)

## 2017-05-01 LAB — TYPE AND SCREEN
ABO/RH(D): O POS
Antibody Screen: NEGATIVE

## 2017-05-01 LAB — ABO/RH: ABO/RH(D): O POS

## 2017-05-01 SURGERY — LIGATION, FALLOPIAN TUBE, POSTPARTUM
Anesthesia: Epidural | Laterality: Bilateral | Wound class: Clean Contaminated

## 2017-05-01 MED ORDER — ZOLPIDEM TARTRATE 5 MG PO TABS: 5.0000 mg | ORAL_TABLET | Freq: Every evening | ORAL | Status: DC | PRN

## 2017-05-01 MED ORDER — LACTATED RINGERS IV SOLN
INTRAVENOUS | Status: DC
  Administered 2017-05-01 (×2): via INTRAVENOUS

## 2017-05-01 MED ORDER — PROMETHAZINE HCL 25 MG/ML IJ SOLN: 6.2500 mg | INTRAMUSCULAR | Status: DC | PRN

## 2017-05-01 MED ORDER — SCOPOLAMINE 1 MG/3DAYS TD PT72
MEDICATED_PATCH | TRANSDERMAL | Status: AC
  Filled 2017-05-01: qty 1

## 2017-05-01 MED ORDER — LACTATED RINGERS IV SOLN
500.0000 mL | INTRAVENOUS | Status: DC | PRN
  Administered 2017-05-01: 500 mL via INTRAVENOUS

## 2017-05-01 MED ORDER — PHENYLEPHRINE 40 MCG/ML (10ML) SYRINGE FOR IV PUSH (FOR BLOOD PRESSURE SUPPORT)
PREFILLED_SYRINGE | INTRAVENOUS | Status: AC
  Filled 2017-05-01: qty 10

## 2017-05-01 MED ORDER — SODIUM CHLORIDE 0.9 % IV SOLN: 250.0000 mL | INTRAVENOUS | Status: DC | PRN

## 2017-05-01 MED ORDER — ACETAMINOPHEN 325 MG PO TABS: 650.0000 mg | ORAL_TABLET | ORAL | Status: DC | PRN

## 2017-05-01 MED ORDER — SODIUM BICARBONATE 8.4 % IV SOLN
INTRAVENOUS | Status: DC | PRN
  Administered 2017-05-01 (×4): 5 mL via EPIDURAL

## 2017-05-01 MED ORDER — OXYCODONE-ACETAMINOPHEN 5-325 MG PO TABS: 1.0000 | ORAL_TABLET | ORAL | Status: DC | PRN

## 2017-05-01 MED ORDER — EPHEDRINE 5 MG/ML INJ: 10.0000 mg | INTRAVENOUS | Status: DC | PRN

## 2017-05-01 MED ORDER — BENZOCAINE-MENTHOL 20-0.5 % EX AERO: 1.0000 "application " | INHALATION_SPRAY | CUTANEOUS | Status: DC | PRN

## 2017-05-01 MED ORDER — PRENATAL MULTIVITAMIN CH
1.0000 | ORAL_TABLET | Freq: Every day | ORAL | Status: DC
  Administered 2017-05-02: 1 via ORAL
  Filled 2017-05-01: qty 1

## 2017-05-01 MED ORDER — MIDAZOLAM HCL 2 MG/2ML IJ SOLN: 0.5000 mg | Freq: Once | INTRAMUSCULAR | Status: DC | PRN

## 2017-05-01 MED ORDER — ONDANSETRON HCL 4 MG PO TABS: 4.0000 mg | ORAL_TABLET | ORAL | Status: DC | PRN

## 2017-05-01 MED ORDER — MORPHINE SULFATE (PF) 4 MG/ML IV SOLN: 1.0000 mg | INTRAVENOUS | Status: DC | PRN

## 2017-05-01 MED ORDER — FLEET ENEMA 7-19 GM/118ML RE ENEM: 1.0000 | ENEMA | RECTAL | Status: DC | PRN

## 2017-05-01 MED ORDER — TERBUTALINE SULFATE 1 MG/ML IJ SOLN: 0.2500 mg | Freq: Once | INTRAMUSCULAR | Status: DC | PRN

## 2017-05-01 MED ORDER — DIPHENHYDRAMINE HCL 25 MG PO CAPS: 25.0000 mg | ORAL_CAPSULE | Freq: Four times a day (QID) | ORAL | Status: DC | PRN

## 2017-05-01 MED ORDER — OXYTOCIN 40 UNITS IN LACTATED RINGERS INFUSION - SIMPLE MED
INTRAVENOUS | Status: AC
  Filled 2017-05-01: qty 1000

## 2017-05-01 MED ORDER — ONDANSETRON HCL 4 MG/2ML IJ SOLN: 4.0000 mg | Freq: Four times a day (QID) | INTRAMUSCULAR | Status: DC | PRN

## 2017-05-01 MED ORDER — DIPHENHYDRAMINE HCL 50 MG/ML IJ SOLN: 12.5000 mg | INTRAMUSCULAR | Status: DC | PRN

## 2017-05-01 MED ORDER — ONDANSETRON HCL 4 MG/2ML IJ SOLN
INTRAMUSCULAR | Status: DC | PRN
  Administered 2017-05-01: 4 mg via INTRAVENOUS

## 2017-05-01 MED ORDER — FENTANYL CITRATE (PF) 100 MCG/2ML IJ SOLN: 100.0000 ug | INTRAMUSCULAR | Status: DC | PRN

## 2017-05-01 MED ORDER — FENTANYL 2.5 MCG/ML BUPIVACAINE 1/10 % EPIDURAL INFUSION (WH - ANES)
INTRAMUSCULAR | Status: AC
  Filled 2017-05-01: qty 100

## 2017-05-01 MED ORDER — MEPERIDINE HCL 25 MG/ML IJ SOLN: 6.2500 mg | INTRAMUSCULAR | Status: DC | PRN

## 2017-05-01 MED ORDER — OXYCODONE-ACETAMINOPHEN 5-325 MG PO TABS: 2.0000 | ORAL_TABLET | ORAL | Status: DC | PRN

## 2017-05-01 MED ORDER — PHENYLEPHRINE 40 MCG/ML (10ML) SYRINGE FOR IV PUSH (FOR BLOOD PRESSURE SUPPORT): 80.0000 ug | PREFILLED_SYRINGE | INTRAVENOUS | Status: DC | PRN

## 2017-05-01 MED ORDER — FENTANYL CITRATE (PF) 100 MCG/2ML IJ SOLN
INTRAMUSCULAR | Status: DC | PRN
  Administered 2017-05-01: 100 ug via INTRAVENOUS

## 2017-05-01 MED ORDER — LACTATED RINGERS IV SOLN
INTRAVENOUS | Status: DC | PRN
  Administered 2017-05-01: 16:00:00 via INTRAVENOUS

## 2017-05-01 MED ORDER — SENNOSIDES-DOCUSATE SODIUM 8.6-50 MG PO TABS
2.0000 | ORAL_TABLET | ORAL | Status: DC
  Administered 2017-05-02: 2 via ORAL
  Filled 2017-05-01: qty 2

## 2017-05-01 MED ORDER — IBUPROFEN 600 MG PO TABS
600.0000 mg | ORAL_TABLET | Freq: Four times a day (QID) | ORAL | Status: DC
  Administered 2017-05-01 – 2017-05-02 (×3): 600 mg via ORAL
  Filled 2017-05-01 (×3): qty 1

## 2017-05-01 MED ORDER — LIDOCAINE HCL (PF) 1 % IJ SOLN
30.0000 mL | INTRAMUSCULAR | Status: DC | PRN
  Filled 2017-05-01: qty 30

## 2017-05-01 MED ORDER — FENTANYL 2.5 MCG/ML BUPIVACAINE 1/10 % EPIDURAL INFUSION (WH - ANES)
14.0000 mL/h | INTRAMUSCULAR | Status: DC | PRN
  Administered 2017-05-01: 12 mL/h via EPIDURAL

## 2017-05-01 MED ORDER — OXYTOCIN BOLUS FROM INFUSION
500.0000 mL | Freq: Once | INTRAVENOUS | Status: AC
  Administered 2017-05-01: 500 mL via INTRAVENOUS

## 2017-05-01 MED ORDER — TETANUS-DIPHTH-ACELL PERTUSSIS 5-2.5-18.5 LF-MCG/0.5 IM SUSP: 0.5000 mL | Freq: Once | INTRAMUSCULAR | Status: DC

## 2017-05-01 MED ORDER — LACTATED RINGERS IV SOLN: 500.0000 mL | Freq: Once | INTRAVENOUS | Status: DC

## 2017-05-01 MED ORDER — OXYCODONE-ACETAMINOPHEN 5-325 MG PO TABS
1.0000 | ORAL_TABLET | ORAL | Status: DC | PRN
  Administered 2017-05-01 – 2017-05-02 (×2): 1 via ORAL
  Filled 2017-05-01 (×2): qty 1

## 2017-05-01 MED ORDER — ONDANSETRON HCL 4 MG/2ML IJ SOLN
INTRAMUSCULAR | Status: AC
  Filled 2017-05-01: qty 2

## 2017-05-01 MED ORDER — LACTATED RINGERS IV SOLN: INTRAVENOUS | Status: DC

## 2017-05-01 MED ORDER — MIDAZOLAM HCL 2 MG/2ML IJ SOLN
INTRAMUSCULAR | Status: AC
  Filled 2017-05-01: qty 2

## 2017-05-01 MED ORDER — DIBUCAINE 1 % RE OINT: 1.0000 "application " | TOPICAL_OINTMENT | RECTAL | Status: DC | PRN

## 2017-05-01 MED ORDER — ONDANSETRON HCL 4 MG/2ML IJ SOLN: 4.0000 mg | INTRAMUSCULAR | Status: DC | PRN

## 2017-05-01 MED ORDER — LIDOCAINE-EPINEPHRINE (PF) 2 %-1:200000 IJ SOLN
INTRAMUSCULAR | Status: AC
  Filled 2017-05-01: qty 20

## 2017-05-01 MED ORDER — SODIUM CHLORIDE 0.9% FLUSH: 3.0000 mL | INTRAVENOUS | Status: DC | PRN

## 2017-05-01 MED ORDER — OXYTOCIN 40 UNITS IN LACTATED RINGERS INFUSION - SIMPLE MED: 2.5000 [IU]/h | INTRAVENOUS | Status: DC

## 2017-05-01 MED ORDER — MIDAZOLAM HCL 2 MG/2ML IJ SOLN
INTRAMUSCULAR | Status: DC | PRN
  Administered 2017-05-01: 2 mg via INTRAVENOUS

## 2017-05-01 MED ORDER — MEASLES, MUMPS & RUBELLA VAC ~~LOC~~ INJ: 0.5000 mL | INJECTION | Freq: Once | SUBCUTANEOUS | Status: DC

## 2017-05-01 MED ORDER — FENTANYL CITRATE (PF) 100 MCG/2ML IJ SOLN
INTRAMUSCULAR | Status: AC
  Filled 2017-05-01: qty 2

## 2017-05-01 MED ORDER — SIMETHICONE 80 MG PO CHEW: 80.0000 mg | CHEWABLE_TABLET | ORAL | Status: DC | PRN

## 2017-05-01 MED ORDER — DIPHENHYDRAMINE HCL 50 MG/ML IJ SOLN
12.5000 mg | INTRAMUSCULAR | Status: DC | PRN
Start: 2017-05-01 — End: 2017-05-01

## 2017-05-01 MED ORDER — WITCH HAZEL-GLYCERIN EX PADS: 1.0000 "application " | MEDICATED_PAD | CUTANEOUS | Status: DC | PRN

## 2017-05-01 MED ORDER — SODIUM CHLORIDE 0.9% FLUSH
3.0000 mL | Freq: Two times a day (BID) | INTRAVENOUS | Status: DC
  Administered 2017-05-02: 3 mL via INTRAVENOUS

## 2017-05-01 MED ORDER — LIDOCAINE HCL (PF) 1 % IJ SOLN
INTRAMUSCULAR | Status: DC | PRN
  Administered 2017-05-01 (×2): 6 mL via EPIDURAL

## 2017-05-01 MED ORDER — SOD CITRATE-CITRIC ACID 500-334 MG/5ML PO SOLN
30.0000 mL | ORAL | Status: DC | PRN
  Administered 2017-05-01: 30 mL via ORAL
  Filled 2017-05-01: qty 15

## 2017-05-01 MED ORDER — COCONUT OIL OIL: 1.0000 "application " | TOPICAL_OIL | Status: DC | PRN

## 2017-05-01 MED ORDER — OXYTOCIN 40 UNITS IN LACTATED RINGERS INFUSION - SIMPLE MED
1.0000 m[IU]/min | INTRAVENOUS | Status: DC
  Administered 2017-05-01: 2 m[IU]/min via INTRAVENOUS

## 2017-05-01 MED ORDER — SODIUM BICARBONATE 8.4 % IV SOLN
INTRAVENOUS | Status: AC
  Filled 2017-05-01: qty 50

## 2017-05-01 MED ORDER — BUPIVACAINE HCL (PF) 0.25 % IJ SOLN
INTRAMUSCULAR | Status: AC
  Filled 2017-05-01: qty 30

## 2017-05-01 SURGICAL SUPPLY — 20 items
CLIP FILSHIE TUBAL LIGA STRL (Clip) ×3 IMPLANT
CLOTH BEACON ORANGE TIMEOUT ST (SAFETY) ×3 IMPLANT
DRSG OPSITE POSTOP 3X4 (GAUZE/BANDAGES/DRESSINGS) ×3 IMPLANT
DURAPREP 26ML APPLICATOR (WOUND CARE) ×3 IMPLANT
GLOVE BIOGEL PI IND STRL 7.0 (GLOVE) ×2 IMPLANT
GLOVE BIOGEL PI INDICATOR 7.0 (GLOVE) ×4
GLOVE ECLIPSE 7.0 STRL STRAW (GLOVE) ×6 IMPLANT
GOWN STRL REUS W/TWL LRG LVL3 (GOWN DISPOSABLE) ×6 IMPLANT
NEEDLE HYPO 22GX1.5 SAFETY (NEEDLE) ×3 IMPLANT
NS IRRIG 1000ML POUR BTL (IV SOLUTION) ×3 IMPLANT
PACK ABDOMINAL MINOR (CUSTOM PROCEDURE TRAY) ×3 IMPLANT
PROTECTOR NERVE ULNAR (MISCELLANEOUS) ×3 IMPLANT
SPONGE LAP 4X18 X RAY DECT (DISPOSABLE) IMPLANT
SUT VIC AB 0 CT1 27 (SUTURE) ×2
SUT VIC AB 0 CT1 27XBRD ANBCTR (SUTURE) ×1 IMPLANT
SUT VICRYL 4-0 PS2 18IN ABS (SUTURE) ×3 IMPLANT
SYR CONTROL 10ML LL (SYRINGE) ×3 IMPLANT
TOWEL OR 17X24 6PK STRL BLUE (TOWEL DISPOSABLE) ×6 IMPLANT
TRAY FOLEY CATH SILVER 14FR (SET/KITS/TRAYS/PACK) ×3 IMPLANT
WATER STERILE IRR 1000ML POUR (IV SOLUTION) ×3 IMPLANT

## 2017-05-01 NOTE — Anesthesia Preprocedure Evaluation (Signed)
Anesthesia Evaluation  Patient identified by MRN, date of birth, ID band Patient awake    Reviewed: Allergy & Precautions, NPO status , Patient's Chart, lab work & pertinent test results  History of Anesthesia Complications Negative for: history of anesthetic complications  Airway Mallampati: IV  TM Distance: >3 FB Neck ROM: Full    Dental  (+) Dental Advisory Given   Pulmonary former smoker,    breath sounds clear to auscultation       Cardiovascular negative cardio ROS   Rhythm:Regular Rate:Normal     Neuro/Psych Anxiety Depression Bipolar Disorder negative neurological ROS     GI/Hepatic Neg liver ROS, GERD  Medicated,  Endo/Other  negative endocrine ROS  Renal/GU negative Renal ROS     Musculoskeletal   Abdominal (+) + obese,   Peds  Hematology plt 230k   Anesthesia Other Findings   Reproductive/Obstetrics (+) Pregnancy                             Anesthesia Physical Anesthesia Plan  ASA: II  Anesthesia Plan: Epidural   Post-op Pain Management:    Induction:   PONV Risk Score and Plan: Treatment may vary due to age or medical condition  Airway Management Planned: Natural Airway  Additional Equipment:   Intra-op Plan:   Post-operative Plan:   Informed Consent: I have reviewed the patients History and Physical, chart, labs and discussed the procedure including the risks, benefits and alternatives for the proposed anesthesia with the patient or authorized representative who has indicated his/her understanding and acceptance.   Dental advisory given  Plan Discussed with:   Anesthesia Plan Comments: (Patient identified. Risks/Benefits/Options discussed with patient including but not limited to bleeding, infection, nerve damage, paralysis, failed block, incomplete pain control, headache, blood pressure changes, nausea, vomiting, reactions to medication both or allergic,  itching and postpartum back pain. Confirmed with bedside nurse the patient's most recent platelet count. Confirmed with patient that they are not currently taking any anticoagulation, have any bleeding history or any family history of bleeding disorders. Patient expressed understanding and wished to proceed. All questions were answered. )        Anesthesia Quick Evaluation

## 2017-05-01 NOTE — Progress Notes (Signed)
Doris King is a 31 y.o. [id number] at [redacted]w[redacted]d by ultrasound admitted for induction of labor due to Gestational diabetes.  Subjective:   Objective: BP (!) 131/59   Pulse 71   Temp 97.8 F (36.6 C) (Axillary)   Resp 18   Ht  (1.626 m)   Wt 215 lb 6.4 oz (97.7 kg)   LMP 07/25/2016   BMI 36.97 kg/m  No intake/output data recorded. No intake/output data recorded.  FHT:  FHR: 120's bpm, variability: moderate,  accelerations:  Present,  decelerations:  Absent UC:   regular, every 5-6 and mild minutes SVE:   Dilation: 4.5 Effacement (%): 70 Station: -1 Exam by:: Rosary Lively Mitchell;RN   Labs: Lab Results  Component Value Date   WBC 8.7 05/01/2017   HGB 11.9 (L) 05/01/2017   HCT 36.1 05/01/2017   MCV 82.6 05/01/2017   PLT 230 05/01/2017    Assessment / Plan: Induction of labor due to gestational hypertension,  progressing well on pitocin  Labor: Progressing on Pitocin, will continue to increase then AROM Preeclampsia:  no signs or symptoms of toxicity Fetal Wellbeing:  Category I Pain Control:  Labor support without medications I/D:  n/a Anticipated MOD:  NSVD  [name] 05/01/2017, 12:20 PM

## 2017-05-01 NOTE — Anesthesia Pain Management Evaluation Note (Signed)
  CRNA Pain Management Visit Note  Patient: Doris King, 31 y.o., female  "Hello I am a member of the anesthesia team at [facility]. We have an anesthesia team available at all times to provide care throughout the hospital, including epidural management and anesthesia for C-section. I don't know your plan for the delivery whether it a natural birth, water birth, IV sedation, nitrous supplementation, doula or epidural, but we want to meet your pain goals."   1.Was your pain managed to your expectations on prior hospitalizations?   Yes   2.What is your expectation for pain management during this hospitalization?     Epidural  3.How can we help you reach that goal? Epidural when patient reaches pain threshold.  Record the patient's initial score and the patient's pain goal.   Pain: 0  Pain Goal: 5 The [facility] wants you to be able to say your pain was always managed very well.  Doris King 05/01/2017

## 2017-05-01 NOTE — Transfer of Care (Signed)
33Immediate Anesthesia Transfer of Care Note  Patient: Doris King  Procedure(s) Performed: POST PARTUM TUBAL LIGATION (Bilateral )  Patient Location: PACU  Anesthesia Type:Epidural  Level of Consciousness: awake, alert  and oriented  Airway & Oxygen Therapy: Patient Spontanous Breathing  Post-op Assessment: Report given to RN and Post -op Vital signs reviewed and stable  Post vital signs: Reviewed and stable  Last Vitals:  Vitals:   05/01/17 1532 05/01/17 1601  BP: 107/70 104/67  Pulse: 82 (!) 121  Resp:    Temp:      Last Pain:  Vitals:   05/01/17 1251  TempSrc:   PainSc: 3          Complications: No apparent anesthesia complications

## 2017-05-01 NOTE — Anesthesia Postprocedure Evaluation (Signed)
Anesthesia Post Note  Patient: Doris King  Procedure(s) Performed: AN AD HOC LABOR EPIDURAL     Patient location during evaluation: L&D Anesthesia Type: Epidural Level of consciousness: awake and alert, oriented and patient cooperative Pain management: pain level controlled Vital Signs Assessment: post-procedure vital signs reviewed and stable Respiratory status: spontaneous breathing, nonlabored ventilation and respiratory function stable Cardiovascular status: blood pressure returned to baseline and stable Postop Assessment: patient able to bend at knees Anesthetic complications: no Comments: Pt for BTL, with existing epidural catheter    Last Vitals:  Vitals:   05/01/17 1601 05/01/17 1715  BP: 104/67 (!) 105/58  Pulse: (!) 121 75  Resp:  16  Temp:  36.9 C    Last Pain:  Vitals:   05/01/17 1715  TempSrc: Oral  PainSc: 0-No pain   Pain Goal:                 Doris King,E. Safaa Stingley

## 2017-05-01 NOTE — Anesthesia Procedure Notes (Signed)
Procedure Name: MAC Date/Time: 05/01/2017 4:30 PM Performed by: Ilyssa Grennan, Sheron Nightingale Pre-anesthesia Checklist: Patient identified, Emergency Drugs available, Suction available, Patient being monitored and Timeout performed Induction Type: IV induction

## 2017-05-01 NOTE — Anesthesia Postprocedure Evaluation (Signed)
Anesthesia Post Note  Patient: Doris King  Procedure(s) Performed: POST PARTUM TUBAL LIGATION (Bilateral )     Patient location during evaluation: PACU Anesthesia Type: Epidural Level of consciousness: awake and alert, patient cooperative and oriented Pain management: pain level controlled Vital Signs Assessment: post-procedure vital signs reviewed and stable Respiratory status: spontaneous breathing, nonlabored ventilation and respiratory function stable Cardiovascular status: blood pressure returned to baseline and stable Postop Assessment: patient able to bend at knees, epidural receding, no apparent nausea or vomiting and adequate PO intake Anesthetic complications: no    Last Vitals:  Vitals:   05/01/17 1930 05/01/17 1945  BP: (!) 123/59 135/74  Pulse: (!) 102 100  Resp: 20 (!) 22  Temp:    SpO2: 98% 100%    Last Pain:  Vitals:   05/01/17 1900  TempSrc:   PainSc: 0-No pain   Pain Goal:                 Ellias Mcelreath,E. Macon Lesesne

## 2017-05-01 NOTE — H&P (Signed)
Doris King is a 31 y.o. female [phone] presenting for IOL for post. OB History    Gravida Para Term Preterm AB Living   SAB TAB Ectopic Multiple Live Births   0 1 0 0 5      Obstetric Comments   G1: 2006. TSVD 6lbs 3oz. No issues G2: 2007. TSVD. 6lbs 3oz. Multiple pyelo admits G3: 2010. PPROM at 35-36wks. SVD. 5lbs 8oz G4: 2011. EAB at 18wks  G5: 2004. TSVD 6lbs 3oz G6: 2013 urgent c/s for what sounds like abruption. Term. Pt never told she  couldn't labor in the future. 6lbs G7: 2017 SVD. Pt was incarcerated and felt like she had to go to bathroom and then delivered. Didn't have to go to hospital. She states that they took her to an OB office in [facility] and they removed some "placenta "     Past Medical History:  Diagnosis Date  . Anemia   . Anxiety   . Bipolar affective disorder (HCC)   . Depression    Past Surgical History:  Procedure Laterality Date  . CESAREAN SECTION  2016  . DILATION AND CURETTAGE OF UTERUS     EAB at 18wks   Family History: family history is not on file. Social History:  reports that she quit smoking about 6 months ago. Her smoking use included Cigarettes. She has never used smokeless tobacco. She reports that she does not drink alcohol or use drugs.     Maternal Diabetes: Yes:  Diabetes Type:  Diet controlled Genetic Screening: Normal Maternal Ultrasounds/Referrals: Normal Fetal Ultrasounds or other Referrals:  None Maternal Substance Abuse:  No Significant Maternal Medications:  None Significant Maternal Lab Results:  None Other Comments:  None  Review of Systems  Constitutional: Negative.   HENT: Negative.   Eyes: Negative.   Respiratory: Negative.   Cardiovascular: Negative.   Gastrointestinal: Negative.   Genitourinary: Negative.   Musculoskeletal: Negative.   Skin: Negative.   Neurological: Negative.   Endo/Heme/Allergies: Negative.   Psychiatric/Behavioral: Negative.    Maternal Medical History:  Reason  for admission: IOL for GDM  Fetal activity: Perceived fetal activity is normal.   Last perceived fetal movement was within the past hour.    Prenatal complications: no prenatal complications Prenatal Complications - Diabetes: gestational. Diabetes is managed by diet.      Dilation: 2 Effacement (%): 70 Station: -2 Exam by:: Doris King CNM Blood pressure 138/81, pulse 96, temperature 97.8 F (36.6 C), temperature source Axillary, resp. rate 20, height  (1.626 m), weight 215 lb 6.4 oz (97.7 kg), last menstrual period 07/25/2016. Maternal Exam:  Abdomen: Patient reports no abdominal tenderness. Surgical scars: low transverse.   Fetal presentation: vertex  Introitus: Normal vulva. Normal vagina.  Ferning test: not done.  Nitrazine test: not done.  Pelvis: adequate for delivery.   Cervix: Cervix evaluated by digital exam.     Fetal Exam Fetal Monitor Review: Mode: ultrasound.   Variability: moderate (6-25 bpm).   Pattern: accelerations present.    Fetal State Assessment: Category I - tracings are normal.     Physical Exam  Constitutional: She is oriented to person, place, and time. She appears well-developed and well-nourished.  HENT:  Head: Normocephalic.  Eyes: Pupils are equal, round, and reactive to light.  Neck: Normal range of motion.  Cardiovascular: Normal rate, regular rhythm, normal heart sounds and intact distal pulses.   Respiratory: Effort normal and breath sounds normal.  GI:  Soft. Bowel sounds are normal.  Genitourinary: Vagina normal and uterus normal.  Musculoskeletal: Normal range of motion.  Neurological: She is alert and oriented to person, place, and time. She has normal reflexes.  Skin: Skin is warm and dry.  Psychiatric: She has a normal mood and affect. Her behavior is normal. Judgment and thought content normal.    Prenatal labs: ABO, Rh: O/Positive/-- (04/12 0000) Antibody: Negative (04/12 0000) Rubella: Immune (04/12 0000) RPR:  Nonreactive (04/12 0000)  HBsAg: Negative (04/12 0000)  HIV: Non-reactive (04/12 0000)  GBS: Negative (08/27 0000)   Assessment/Plan: IOL for GDM GBS neg Tolac with prior successful births Pit induction of labor   Doris King 05/01/2017, 10:24 AM

## 2017-05-01 NOTE — Anesthesia Procedure Notes (Signed)
Epidural Patient location during procedure: OB Start time: 05/01/2017 12:30 PM End time: 05/01/2017 12:53 PM  Staffing Anesthesiologist: Jairo Ben Performed: anesthesiologist   Preanesthetic Checklist Completed: patient identified, surgical consent, pre-op evaluation, timeout performed, IV checked, risks and benefits discussed and monitors and equipment checked  Epidural Patient position: sitting Prep: site prepped and draped and DuraPrep Patient monitoring: blood pressure, continuous pulse ox and heart rate Approach: midline Location: L3-L4 Injection technique: LOR air  Needle:  Needle type: Tuohy  Needle gauge: 17 G Needle length: 9 cm Needle insertion depth: 5 cm Catheter type: closed end flexible Catheter size: 19 Gauge Catheter at skin depth: 10 cm Test dose: negative (1% lidocaine)  Assessment Events: blood not aspirated, injection not painful, no injection resistance, negative IV test and no paresthesia  Additional Notes Pt identified in Labor room.  Monitors applied. Working IV access confirmed. Sterile prep, drape lumbar spine.  1% lido local L 3,4.  #17ga Touhy LOR air at 5 cm L 3,4, cath in easily to 10 cm skin. Test dose OK, cath dosed and infusion begun.  Patient asymptomatic, VSS, no heme aspirated, tolerated well.  Sandford Craze, MDReason for block:procedure for pain

## 2017-05-01 NOTE — Anesthesia Procedure Notes (Signed)
Performed by: Aury Scollard M       

## 2017-05-01 NOTE — Interval H&P Note (Signed)
History and Physical Interval Note:  05/01/2017 3:43 PM  Doris King  has presented today for surgery, with the diagnosis of desires sterilization  The various methods of treatment have been discussed with the patient and family. After consideration of risks, benefits and other options for treatment, the patient has consented to  Procedure(s): POST PARTUM TUBAL LIGATION (Bilateral) as a surgical intervention .  The patient's history has been reviewed, patient examined, no change in status, stable for surgery.  I have reviewed the patient's chart and labs.Patient counseled, r.e. Risks benefits of BTL, including permanency of procedure, risk of failure(1:100), increased risk of ectopic.  Patient verbalized understanding and desires to proceed   Questions were answered to the patient's satisfaction.     Reva Bores

## 2017-05-01 NOTE — Op Note (Signed)
Doris King  05/01/2017  PREOPERATIVE DIAGNOSIS:  Multiparity, undesired fertility  POSTOPERATIVE DIAGNOSIS:  Multiparity, undesired fertility  PROCEDURE:  Postpartum Bilateral Tubal Sterilization using Filshie Clips   ANESTHESIA:  Epidural and local analgesia using 0.25% Marcaine  COMPLICATIONS:  None immediate.  ESTIMATED BLOOD LOSS: 5 ml.  INDICATIONS: 31 y.o. [id number]  with undesired fertility,status post vaginal delivery, desires permanent sterilization.  Other reversible forms of contraception were discussed with patient; she declines all other modalities. Risks of procedure discussed with patient including but not limited to: risk of regret, permanence of method, bleeding, infection, injury to surrounding organs and need for additional procedures.  Failure risk of 0.5-1% with increased risk of ectopic gestation if pregnancy occurs was also discussed with patient.     FINDINGS:  Normal uterus and tubes.  PROCEDURE DETAILS: The patient was taken to the operating room where her spinal anesthesia was dosed up to surgical level and found to be adequate.  She was then placed in a supine position and prepped and draped in the usual sterile fashion.  After an adequate timeout was performed, attention was turned to the patient's abdomen where a small transverse skin incision was made under the umbilical fold. The incision was taken down to the layer of fascia using the scalpel, and fascia was incised, and extended bilaterally. The peritoneum was entered in a sharp fashion. The patient was placed in Trendelenburg.  A moist lap pad was used to move omentum and bowel away until the left fallopian tube was identified and grasped with a Babcock clamp, and followed out to the fimbriated end.  A Filshie clip was placed on the left fallopian tube about 2 cm from the cornu.  A similar process was carried out on the right side allowing for bilateral tubal sterilization.  Good hemostasis was noted overall.   Local analgesia was injected into both Filshie application sites.The instruments were then removed from the patient's abdomen and the fascial incision was repaired with 0 Vicryl, and the skin was closed with a 4-0 Vicryl subcuticular stitch. The patient tolerated the procedure well.  Sponge, lap, and needle counts were correct times two.  The patient was then taken to the recovery room awake, extubated and in stable condition.  Reva Bores MD 05/01/2017 5:07 PM

## 2017-05-01 NOTE — Anesthesia Preprocedure Evaluation (Signed)
Anesthesia Evaluation  Patient identified by MRN, date of birth, ID band Patient awake    Reviewed: Allergy & Precautions, NPO status , Patient's Chart, lab work & pertinent test results  History of Anesthesia Complications Negative for: history of anesthetic complications  Airway Mallampati: II  TM Distance: >3 FB Neck ROM: Full    Dental  (+) Dental Advisory Given   Pulmonary former smoker,    breath sounds clear to auscultation       Cardiovascular negative cardio ROS   Rhythm:Regular Rate:Normal     Neuro/Psych Anxiety Depression Bipolar Disorder    GI/Hepatic Neg liver ROS, GERD  Medicated,  Endo/Other  diabetes, GestationalMorbid obesity  Renal/GU negative Renal ROS     Musculoskeletal   Abdominal (+) + obese,   Peds  Hematology plt 230k   Anesthesia Other Findings   Reproductive/Obstetrics S/p NSVD with LEA Not breast feeding                             Anesthesia Physical Anesthesia Plan  ASA: III  Anesthesia Plan: Epidural   Post-op Pain Management:    Induction:   PONV Risk Score and Plan: 2 and Ondansetron and Treatment may vary due to age or medical condition  Airway Management Planned: Natural Airway  Additional Equipment:   Intra-op Plan:   Post-operative Plan:   Informed Consent: I have reviewed the patients History and Physical, chart, labs and discussed the procedure including the risks, benefits and alternatives for the proposed anesthesia with the patient or authorized representative who has indicated his/her understanding and acceptance.   Dental advisory given  Plan Discussed with: CRNA and Surgeon  Anesthesia Plan Comments: (Routine monitors, with pre-existing epidural catheter)        Anesthesia Quick Evaluation

## 2017-05-02 ENCOUNTER — Encounter (HOSPITAL_COMMUNITY): Payer: Self-pay | Admitting: Family Medicine

## 2017-05-02 ENCOUNTER — Encounter: Payer: Medicaid Other | Admitting: Obstetrics & Gynecology

## 2017-05-02 ENCOUNTER — Encounter (HOSPITAL_COMMUNITY): Payer: Self-pay

## 2017-05-02 LAB — RPR: RPR Ser Ql: NONREACTIVE

## 2017-05-02 LAB — GLUCOSE, CAPILLARY: Glucose-Capillary: 95 mg/dL (ref 65–99)

## 2017-05-02 NOTE — Addendum Note (Signed)
Addendum  created 05/02/17 0737 by Santino Kinsella, CRNA   Sign clinical note    

## 2017-05-02 NOTE — Discharge Instructions (Signed)

## 2017-05-02 NOTE — Anesthesia Postprocedure Evaluation (Signed)
Anesthesia Post Note  Patient: Doris King  Procedure(s) Performed: AN AD HOC LABOR EPIDURAL     Patient location during evaluation: Mother Baby Anesthesia Type: Epidural Level of consciousness: awake and alert Pain management: pain level controlled Vital Signs Assessment: post-procedure vital signs reviewed and stable Respiratory status: spontaneous breathing, nonlabored ventilation and respiratory function stable Cardiovascular status: stable Postop Assessment: no headache, no backache and epidural receding Anesthetic complications: no    Last Vitals:  Vitals:   05/02/17 0125 05/02/17 0530  BP: 107/65 (!) 101/49  Pulse: (!) 57 (!) 57  Resp: 18 18  Temp: 36.6 C 36.6 C  SpO2:      Last Pain:  Vitals:   05/02/17 0530  TempSrc: Oral  PainSc: 3    Pain Goal:                 [name]

## 2017-05-02 NOTE — Progress Notes (Signed)
Post Partum Day 1  Subjective:  Doris King is a 31 y.o. [id number] [redacted]w[redacted]d s/p SVD.  No acute events overnight.  Pt denies problems with ambulating, voiding or po intake.  She denies nausea or vomiting.  Pain is well controlled. Lochia Small.  Plan for birth control is bilateral tubal ligation.  Method of Feeding: Breast and bottle  Objective: BP (!) 101/49 (BP Location: Right Arm)   Pulse (!) 57   Temp 97.9 F (36.6 C) (Oral)   Resp 18   Ht  (1.626 m)   Wt 97.7 kg (215 lb 6.4 oz)   LMP 07/25/2016   SpO2 100%   Breastfeeding? Unknown   BMI 36.97 kg/m   Physical Exam:  General: alert, cooperative and no distress Lochia:normal flow Chest: normal work of breathing Heart: regular rate Abdomen: soft, nontender Uterine Fundus: firm DVT Evaluation: No evidence of DVT seen on physical exam.   Recent Labs  05/01/17 0818  HGB 11.9*  HCT 36.1   Fasting CBG this AM 95  Assessment/Plan:  ASSESSMENT: Doris King is a 31 y.o. [id number] [redacted]w[redacted]d ppd #1 s/p NSVD doing well.   Plan for discharge tomorrow and Lactation consult  continue routine pp care Baby in NICU due to hypoglycemia    LOS: 1 day   Caryl Ada 05/02/2017, 9:07 AM

## 2017-05-02 NOTE — Anesthesia Postprocedure Evaluation (Signed)
Anesthesia Post Note  Patient: Doris King  Procedure(s) Performed: POST PARTUM TUBAL LIGATION (Bilateral )     Patient location during evaluation: Mother Baby Anesthesia Type: Epidural Level of consciousness: awake and alert Pain management: pain level controlled Vital Signs Assessment: post-procedure vital signs reviewed and stable Respiratory status: spontaneous breathing, nonlabored ventilation and respiratory function stable Cardiovascular status: stable Postop Assessment: no headache, no backache and epidural receding Anesthetic complications: no    Last Vitals:  Vitals:   05/02/17 0125 05/02/17 0530  BP: 107/65 (!) 101/49  Pulse: (!) 57 (!) 57  Resp: 18 18  Temp: 36.6 C 36.6 C  SpO2:      Last Pain:  Vitals:   05/02/17 0530  TempSrc: Oral  PainSc: 3    Pain Goal:                 [name]

## 2017-05-02 NOTE — Discharge Summary (Signed)
OB Discharge Summary     Patient Name: Doris King DOB: 1985-11-11 MRN: [id number] Date of admission: 05/01/2017  Delivering MD: [name] D )  Date of discharge: 05/02/2017   Admitting diagnosis: pregnancy at [redacted]w[redacted]d gestation, A1GDM Intrauterine pregnancy: [redacted]w[redacted]d    Secondary diagnosis:  Active Problems:   Patient Active Problem List   Diagnosis Date Noted  . GDM, class A1 05/01/2017  . Glucose tolerance test abnormal 04/11/2017  . Unwanted fertility 04/05/2017  . GERD (gastroesophageal reflux disease) 02/28/2017  . Cervical dysplasia 11/29/2016  . History of cesarean delivery 11/29/2016  . Severe obesity (BMI 35.0-39.9) 11/29/2016  . Obesity in pregnancy 11/29/2016  . History of incarceration 11/29/2016  . Marijuana abuse 11/29/2016  . Tobacco abuse 11/29/2016  . History of preterm delivery, currently pregnant in second trimester 11/29/2016  . Late prenatal care 11/29/2016  . Depression, anxiety, bipolar 11/29/2016  . History of pyelonephritis 11/29/2016  . Supervision of high risk pregnancy, antepartum 11/29/2016  . History of placenta abruption 11/29/2016    Additional problems: none    Discharge diagnosis: Term Pregnancy Delivered                                                                                                Post partum procedures:postpartum tubal ligation  Augmentation: AROM and Pitocin  Complications: None  Hospital course:  Induction of Labor With Vaginal Delivery   31 y.o. yo [id number] at [redacted]w[redacted]d was admitted to the hospital 05/01/2017 for induction of labor.  Indication for induction: A1 DM.  Patient had an uncomplicated labor course as follows: Membrane Rupture Time/Date: 1:20 PM ,05/01/2017   Intrapartum Procedures: Episiotomy: None [1]                                         Lacerations:  None [1]  Patient had delivery of a Viable infant.  Information for the patient's newborn:  Merilynn, Haydu [[id number]]  Delivery Method: Vaginal,  Spontaneous Delivery (Filed from Delivery Summary)   05/01/2017  Details of delivery can be found in separate delivery note.  Patient had a routine postpartum course. Patient is discharged home 05/02/17.  Physical exam  Vitals:   05/02/17 0125 05/02/17 0530  BP: 107/65 (!) 101/49  Pulse: (!) 57 (!) 57  Resp: 18 18  Temp: 97.8 F (36.6 C) 97.9 F (36.6 C)  SpO2:      Physical Exam:  General: alert, cooperative and no distress Lochia:normal flow Chest: normal work of breathing Heart: regular rate Abdomen: soft, nontender Uterine Fundus: firm DVT Evaluation: No evidence of DVT seen on physical exam.   Labs: Results for orders placed or performed during the hospital encounter of 05/01/17 (from the past 24 hour(s))  Glucose, capillary     Status: None   Collection Time: 05/02/17  8:11 AM  Result Value Ref Range   Glucose-Capillary 95 65 - 99 mg/dL     Discharge instruction: per After Visit Summary and "Baby and Me Booklet".  After visit meds:  No Known Allergies  Allergies as of 05/02/2017   No Known Allergies     Medication List    STOP taking these medications   famotidine 20 MG tablet Commonly known as:  PEPCID   metroNIDAZOLE 500 MG tablet Commonly known as:  FLAGYL   pantoprazole 40 MG tablet Commonly known as:  PROTONIX     TAKE these medications   acetaminophen 325 MG tablet Commonly known as:  TYLENOL Take 650 mg by mouth every 6 (six) hours as needed for moderate pain.   cyclobenzaprine 10 MG tablet Commonly known as:  FLEXERIL Take 1 tablet (10 mg total) by mouth every 8 (eight) hours as needed for muscle spasms.   prenatal multivitamin Tabs tablet Take 1 tablet by mouth daily at 12 noon.        Diet: routine diet  Activity: Advance as tolerated. Pelvic rest for 6 weeks.   Outpatient follow up:6 weeks Future Appointments: No future appointments.  Follow up Appt: No Follow-up on file.     Postpartum contraception: Tubal  Ligation  Newborn Data: APGAR (1 MIN):   APGAR (5 MINS): 9   APGAR (10 MINS):   @   Baby Feeding: Bottle Disposition:home with mother  Rolm Bookbinder, DO  05/02/2017

## 2017-05-02 NOTE — Addendum Note (Signed)
Addendum  created 05/02/17 0737 by [name], CRNA   Sign clinical note

## 2017-06-06 ENCOUNTER — Encounter: Payer: Self-pay | Admitting: Medical

## 2017-06-06 ENCOUNTER — Ambulatory Visit (INDEPENDENT_AMBULATORY_CARE_PROVIDER_SITE_OTHER): Payer: Medicaid Other | Admitting: Medical

## 2017-06-06 DIAGNOSIS — Z1389 Encounter for screening for other disorder: Secondary | ICD-10-CM | POA: Diagnosis not present

## 2017-06-06 NOTE — Progress Notes (Signed)
Subjective:     Doris King is a 31 y.o. female who presents for a postpartum visit. She is 5 weeks postpartum following a spontaneous vaginal delivery. I have fully reviewed the prenatal and intrapartum course. The delivery was at 40 gestational weeks. Outcome: spontaneous vaginal delivery. Anesthesia: epidural. Postpartum course has been normal. Baby's course has been normal. Baby is feeding by bottle Rush Barer- Gerber. Bleeding no bleeding. Bowel function is normal. Bladder function is normal. Patient is sexually active. Contraception method is tubal ligation. Postpartum depression screening: positive.  The following portions of the patient's history were reviewed and updated as appropriate: allergies, current medications, past family history, past medical history, past social history, past surgical history and problem list.  Review of Systems Pertinent items noted in HPI and remainder of comprehensive ROS otherwise negative.   Objective:    BP 112/87   Pulse 73   Wt 206 lb 14.4 oz (93.8 kg)   BMI 35.51 kg/m   General:  alert, cooperative and no distress   Breasts:  inspection negative, no nipple discharge or bleeding, no masses or nodularity palpable  Lungs: clear to auscultation bilaterally and normal percussion bilaterally  Heart:  regular rate and rhythm, S1, S2 normal, no murmur, click, rub or gallop  Abdomen: soft, non-tender; bowel sounds normal; no masses,  no organomegaly   Vulva:  not evaluated  Vagina: not evaluated  Cervix:  not evaluated  Corpus: not examined  Adnexa:  not evaluated  Rectal Exam: Not performed.        Assessment:     Normal postpartum exam. Pap smear not done at today's visit. Last pap in April 2018, normal  Plan:    1. Contraception: tubal ligation 2. Positive depression screen- Asher MuirJamie to call patient and follow up tomorrow, resources for community mental health places given to patient 3. Follow up in: 1 year or as needed.

## 2017-06-13 ENCOUNTER — Telehealth: Payer: Self-pay | Admitting: Clinical

## 2017-06-13 NOTE — Telephone Encounter (Signed)
Follow-up for mood check postpartum. [facility], but says she appreciates [facility] checking on her today, and is feeling better than at her last medical visit. Pt aware she may call [facility] [phone] to make an appointment, if needed.

## 2017-08-22 ENCOUNTER — Other Ambulatory Visit: Payer: Self-pay

## 2017-08-22 ENCOUNTER — Emergency Department (HOSPITAL_COMMUNITY): Payer: Medicaid Other

## 2017-08-22 ENCOUNTER — Encounter (HOSPITAL_COMMUNITY): Payer: Self-pay

## 2017-08-22 ENCOUNTER — Emergency Department (HOSPITAL_COMMUNITY)
Admission: EM | Admit: 2017-08-22 | Discharge: 2017-08-23 | Disposition: A | Discharge status: Home / Self Care | Payer: Medicaid Other | Attending: Emergency Medicine | Admitting: Emergency Medicine

## 2017-08-22 DIAGNOSIS — R102 Pelvic and perineal pain: Secondary | ICD-10-CM | POA: Diagnosis not present

## 2017-08-22 DIAGNOSIS — N939 Abnormal uterine and vaginal bleeding, unspecified: Secondary | ICD-10-CM | POA: Insufficient documentation

## 2017-08-22 DIAGNOSIS — Z87891 Personal history of nicotine dependence: Secondary | ICD-10-CM | POA: Insufficient documentation

## 2017-08-22 DIAGNOSIS — R1032 Left lower quadrant pain: Secondary | ICD-10-CM | POA: Diagnosis present

## 2017-08-22 LAB — COMPREHENSIVE METABOLIC PANEL
ALT: 57 U/L — ABNORMAL HIGH (ref 14–54)
AST: 30 U/L (ref 15–41)
Albumin: 4 g/dL (ref 3.5–5.0)
Alkaline Phosphatase: 97 U/L (ref 38–126)
Anion gap: 13 (ref 5–15)
BUN: 11 mg/dL (ref 6–20)
CHLORIDE: 106 mmol/L (ref 101–111)
CO2: 20 mmol/L — AB (ref 22–32)
Calcium: 9.3 mg/dL (ref 8.9–10.3)
Creatinine, Ser: 0.69 mg/dL (ref 0.44–1.00)
Glucose, Bld: 113 mg/dL — ABNORMAL HIGH (ref 65–99)
Potassium: 4.1 mmol/L (ref 3.5–5.1)
SODIUM: 139 mmol/L (ref 135–145)
Total Bilirubin: 0.4 mg/dL (ref 0.3–1.2)
Total Protein: 6.9 g/dL (ref 6.5–8.1)

## 2017-08-22 LAB — URINALYSIS, ROUTINE W REFLEX MICROSCOPIC
Bilirubin Urine: NEGATIVE
Glucose, UA: NEGATIVE mg/dL
Ketones, ur: NEGATIVE mg/dL
Leukocytes, UA: NEGATIVE
Nitrite: NEGATIVE
Protein, ur: NEGATIVE mg/dL
SPECIFIC GRAVITY, URINE: 1.028 (ref 1.005–1.030)
pH: 5 (ref 5.0–8.0)

## 2017-08-22 LAB — CBC
HCT: 44.6 % (ref 36.0–46.0)
HEMOGLOBIN: 14.6 g/dL (ref 12.0–15.0)
MCH: 28 pg (ref 26.0–34.0)
MCHC: 32.7 g/dL (ref 30.0–36.0)
MCV: 85.6 fL (ref 78.0–100.0)
Platelets: 309 10*3/uL (ref 150–400)
RBC: 5.21 MIL/uL — AB (ref 3.87–5.11)
RDW: 14.3 % (ref 11.5–15.5)
WBC: 11.2 10*3/uL — AB (ref 4.0–10.5)

## 2017-08-22 LAB — WET PREP, GENITAL
Sperm: NONE SEEN
TRICH WET PREP: NONE SEEN
Yeast Wet Prep HPF POC: NONE SEEN

## 2017-08-22 LAB — I-STAT BETA HCG BLOOD, ED (MC, WL, AP ONLY): I-stat hCG, quantitative: <5 m[IU]/mL (ref <5)

## 2017-08-22 LAB — LIPASE, BLOOD: LIPASE: 27 U/L (ref 11–51)

## 2017-08-22 MED ORDER — IBUPROFEN 800 MG PO TABS
800.0000 mg | ORAL_TABLET | Freq: Once | ORAL | Status: AC
  Administered 2017-08-22: 800 mg via ORAL
  Filled 2017-08-22: qty 1

## 2017-08-22 MED ORDER — OXYCODONE-ACETAMINOPHEN 5-325 MG PO TABS
1.0000 | ORAL_TABLET | Freq: Once | ORAL | Status: AC
  Administered 2017-08-22: 1 via ORAL
  Filled 2017-08-22: qty 1

## 2017-08-22 MED ORDER — IOPAMIDOL (ISOVUE-300) INJECTION 61%
INTRAVENOUS | Status: AC
  Administered 2017-08-22: 100 mL via INTRAVENOUS
  Filled 2017-08-22: qty 100

## 2017-08-22 NOTE — ED Notes (Signed)
Patient transported to US 

## 2017-08-22 NOTE — ED Notes (Signed)
Patient transported to CT 

## 2017-08-22 NOTE — ED Provider Notes (Signed)
[facility] Provider Note   CSN: [id number] Arrival date & time: 08/22/17  1608     History   Chief Complaint Chief Complaint  Patient presents with  . Abdominal Pain    HPI Doris ChesterWhitney King is a 32 y.o. female.  The history is provided by the patient. No language interpreter was used.  Abdominal Pain      Doris King is a 32 y.o. female who presents to the Emergency Department complaining of abdominal pain.  She reports lower abdominal pain in the left lower quadrant that began yesterday morning.  Pain is sharp and constant in nature.  It radiates around the left lower quadrant.  No fevers, nausea, vomiting, dysuria.  She did begin some light spotting yesterday which is unusual for her.  No prior similar symptoms.  She does endorse just feeling unwell in general.  She has a history of tubal ligation at 4 months ago and had a normal delivery in September 2018.  Past Medical History:  Diagnosis Date  . Anemia   . Anxiety   . Bipolar affective disorder (HCC)   . Depression     Patient Active Problem List   Diagnosis Date Noted  . GDM, class A1 05/01/2017  . Glucose tolerance test abnormal 04/11/2017  . Unwanted fertility 04/05/2017  . GERD (gastroesophageal reflux disease) 02/28/2017  . Cervical dysplasia 11/29/2016  . History of cesarean delivery 11/29/2016  . Severe obesity (BMI 35.0-39.9) 11/29/2016  . Obesity in pregnancy 11/29/2016  . History of incarceration 11/29/2016  . Marijuana abuse 11/29/2016  . Tobacco abuse 11/29/2016  . History of preterm delivery, currently pregnant in second trimester 11/29/2016  . Late prenatal care 11/29/2016  . Depression, anxiety, bipolar 11/29/2016  . History of pyelonephritis 11/29/2016  . Supervision of high risk pregnancy, antepartum 11/29/2016  . History of placenta abruption 11/29/2016    Past Surgical History:  Procedure Laterality Date  . CESAREAN SECTION  2016  . DILATION AND  CURETTAGE OF UTERUS     EAB at 18wks  . TUBAL LIGATION Bilateral 05/01/2017   Procedure: POST PARTUM TUBAL LIGATION;  Surgeon: Reva BoresPratt, Tanya S, MD;  [facility];  Service: Gynecology;  Laterality: Bilateral;    OB History    Gravida Para Term Preterm AB Living   8 7 5 2 1 6    SAB TAB Ectopic Multiple Live Births   0 1 0 0 6      Obstetric Comments   G1: 2006. TSVD 6lbs 3oz. No issues G2: 2007. TSVD. 6lbs 3oz. Multiple pyelo admits G3: 2010. PPROM at 35-36wks. SVD. 5lbs 8oz G4: 2011. EAB at 18wks  G5: 2004. TSVD 6lbs 3oz G6: 2013 urgent c/s for what sounds like abruption. Term. Pt never told she  couldn't labor in the future. 6lbs G7: 2017 SVD. Pt was incarcerated and felt like she had to go to bathroom and then delivered. Didn't have to go to hospital. She states [facility] [place] [place] [facility] "[facility] "       Home Medications    Prior to Admission medications   Medication Sig Start Date End Date Taking? Authorizing Provider  acetaminophen (TYLENOL) 325 MG tablet Take 650 mg by mouth every 6 (six) hours as needed for moderate pain.     [provider]  cyclobenzaprine (FLEXERIL) 10 MG tablet Take 1 tablet (10 mg total) by mouth every 8 (eight) hours as needed for  muscle spasms. 04/13/17   Donette Larry, CNM  Prenatal Vit-Fe Fumarate-FA (PRENATAL MULTIVITAMIN) TABS tablet Take 1 tablet by mouth daily at 12 noon.    [provider]    Family History No family history on file.  Social History Social History   Tobacco Use  . Smoking status: Former Smoker    Types: Cigarettes    Last attempt to quit: 10/06/2016    Years since quitting: 0.8  . Smokeless tobacco: Never Used  Substance Use Topics  . Alcohol use: No  . Drug use: No    Comment: positve THC during pregnancy     Allergies   Patient has no known allergies.   Review of Systems Review of Systems  Gastrointestinal: Positive for  abdominal pain.  All other systems reviewed and are negative.    Physical Exam Updated Vital Signs BP 113/62   Pulse 92   Temp 98.8 F (37.1 C) (Oral)   Resp 17   Ht 5\' 4"  (1.626 m)   Wt 93.4 kg (206 lb)   LMP 08/10/2017   SpO2 100%   BMI 35.36 kg/m   Physical Exam  Constitutional: She is oriented to person, place, and time. She appears well-developed and well-nourished.  HENT:  Head: Normocephalic and atraumatic.  Cardiovascular: Normal rate and regular rhythm.  Pulmonary/Chest: Effort normal. No respiratory distress.  Abdominal: Soft.  Moderate suprapubic tenderness in the left lower quadrant and right lower quadrant.  Genitourinary:  Genitourinary Comments: No significant vaginal discharge.  No CMT.  Significant left adnexal tenderness with no palpable mass.  Minimal right adnexal tenderness.  Musculoskeletal: She exhibits no edema or tenderness.  Neurological: She is alert and oriented to person, place, and time.  Skin: Skin is warm and dry.  Psychiatric: She has a normal mood and affect. Her behavior is normal.  Nursing note and vitals reviewed.    ED Treatments / Results  Labs (all labs ordered are listed, but only abnormal results are displayed) Labs Reviewed  COMPREHENSIVE METABOLIC PANEL - Abnormal; Notable for the following components:      Result Value   CO2 20 (*)    Glucose, Bld 113 (*)    ALT 57 (*)    All other components within normal limits  CBC - Abnormal; Notable for the following components:   WBC 11.2 (*)    RBC 5.21 (*)    All other components within normal limits  URINALYSIS, ROUTINE W REFLEX MICROSCOPIC - Abnormal; Notable for the following components:   APPearance HAZY (*)    Hgb urine dipstick LARGE (*)    Bacteria, UA RARE (*)    Squamous Epithelial / LPF 0-5 (*)    All other components within normal limits  WET PREP, GENITAL  LIPASE, BLOOD  I-STAT BETA HCG BLOOD, ED ([facility], [facility], [facility] ONLY)  GC/CHLAMYDIA PROBE AMP ([place]) NOT AT  [facility]    EKG  EKG Interpretation None       Radiology No results found.  Procedures Procedures (including critical care time)  Medications Ordered in ED Medications - No data to display   Initial Impression / Assessment and Plan / ED Course  I have reviewed the triage vital signs and the nursing notes.  Pertinent labs & imaging results that were available during my care of the patient were reviewed by me and considered in my medical decision making (see chart for details).     Patient here for evaluation of left lower quadrant pain.  She does have  significant tenderness on lower abdominal discharge.  Concern for possible ovarian torsion.  Plan to obtain pelvic ultrasound.  If pelvic ultrasound is with no significant abnormalities recommend CT abdomen to further evaluate her pain.  Patient care transferred pending imaging.  Final Clinical Impressions(s) / ED Diagnoses   Final diagnoses:  None    ED Discharge Orders    None       Tilden Fossa, MD 08/22/17 2038

## 2017-08-22 NOTE — ED Triage Notes (Signed)
Pt presents to the ed with complaints of LLQ pain that is sharp in nature. Denies any other symptoms

## 2017-08-22 NOTE — ED Notes (Signed)
Pt returned from US

## 2017-08-23 LAB — GC/CHLAMYDIA PROBE AMP (~~LOC~~) NOT AT ARMC
Chlamydia: NEGATIVE
Neisseria Gonorrhea: NEGATIVE

## 2017-08-23 MED ORDER — LIDOCAINE HCL (PF) 1 % IJ SOLN
INTRAMUSCULAR | Status: AC
  Administered 2017-08-23: 0.9 mL
  Filled 2017-08-23: qty 5

## 2017-08-23 MED ORDER — DOXYCYCLINE HYCLATE 100 MG PO CAPS: 100.0000 mg | ORAL_CAPSULE | Freq: Two times a day (BID) | ORAL | 0 refills | Status: AC

## 2017-08-23 MED ORDER — NAPROXEN 500 MG PO TABS: 500.0000 mg | ORAL_TABLET | Freq: Two times a day (BID) | ORAL | 0 refills | Status: DC

## 2017-08-23 MED ORDER — CEFTRIAXONE SODIUM 250 MG IJ SOLR
250.0000 mg | Freq: Once | INTRAMUSCULAR | Status: AC
  Administered 2017-08-23: 250 mg via INTRAMUSCULAR
  Filled 2017-08-23: qty 250

## 2017-08-23 MED ORDER — METRONIDAZOLE 500 MG PO TABS: 500.0000 mg | ORAL_TABLET | Freq: Two times a day (BID) | ORAL | 0 refills | Status: AC

## 2017-08-23 NOTE — ED Provider Notes (Signed)
Assumed care from Dr. Madilyn Hookees at shift change.  In brief, this is a 32 year old female who presents emergency department for evaluation of left lower abdominal pain, persistent for the last 24 hours.    She had left adnexal tenderness on exam.  Otherwise she is afebrile, nontoxic-appearing and vital signs stable.  Labs reviewed.  I-STAT beta hCG negative.  Wet prep reveals clue cells with many white blood cells present.  She has a mild leukocytosis of 11.2, CBC otherwise unremarkable.  CMP unremarkable.  UA does not appear infected.  Dr. Anise Salvoeeves ordered a transvaginal ultrasound which does not show TOA or acute abnormality.  CT abdomen pelvis reveals findings consistent with PID.    On recheck patient in NAD and VSS. Abdomen non-tender. She was given IM dose of Rocephin in the ED and will send her home with a prescription for doxycycline and Flagyl to treat PID.  Counseled patient to follow-up with your primary doctor if her symptoms are not improving in 72 hours.  Have also counseled her on strict return precautions including vomiting that will not stop, development of fever, worsening or changing of symptoms in any way.  Have discussed that she has GC/chlamydia cultures which are pending.  If these return positive she will need to inform all sexual partners.  Patient agrees and voiced understanding to the above plan.  She has no complaints prior to discharge.   Vitals:   08/22/17 1829 08/22/17 2200  BP: 113/62 110/62  Pulse: 92 85  Resp: 17 16  Temp:    SpO2: 100% 100%    Shirlyn GoltzEmily Shrosbree PA-C   Lawrence MarseillesShrosbree, Emily J, PA-C 08/23/17 1032    Tilden Fossaees, Elizabeth, MD 08/23/17 1046

## 2017-08-23 NOTE — Discharge Instructions (Addendum)
The CT scan shows inflammation in the left pelvic area.  We will treat you for this with antibiotics.  Please take both antibiotics twice a day for the next 2 weeks.  Please take all of your antibiotics until finished!   You may develop abdominal discomfort or diarrhea from the antibiotic.  You may help offset this with probiotics which you can buy or get in yogurt. Do not eat  or take the probiotics until 2 hours after your antibiotic.   Please take naprosyn for your pain. You can also take tylenol with it.   You have chlamydia and gonorrhea cultures which are pending.  If they are positive, you will receive a phone call in the next 2-3 days.  If you test positive for chlamydia or gonorrhea you will need to inform all sexual partners so that they can be treated as well.  Return to the emergency department if you have fever greater than 100.4 F, vomiting that will not stop or have any new or worsening symptoms.

## 2018-09-18 IMAGING — US US MFM OB TRANSVAGINAL
1 series · 13 of 28 positions shown · non-contrast
Comparison: none

[Series 1: us mfm ob transvaginal · 13 of 72 slices shown]
[im 3/72]
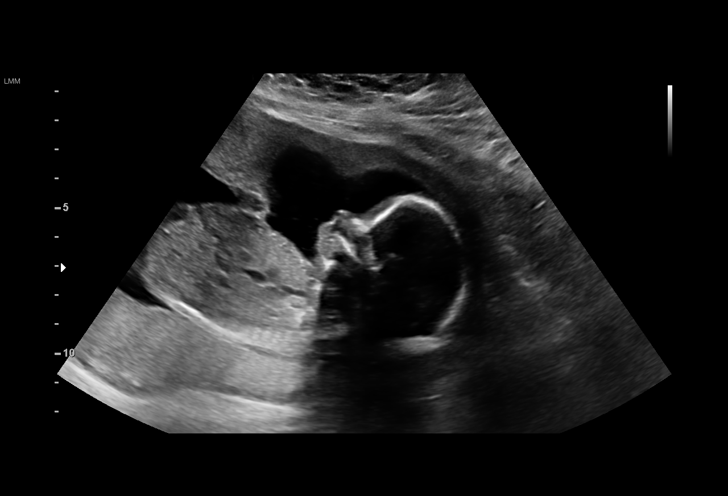
[im 8/72]
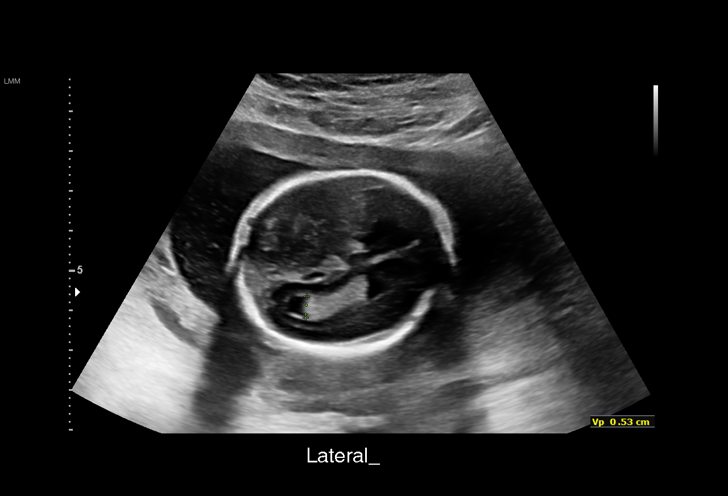
[im 14/72]
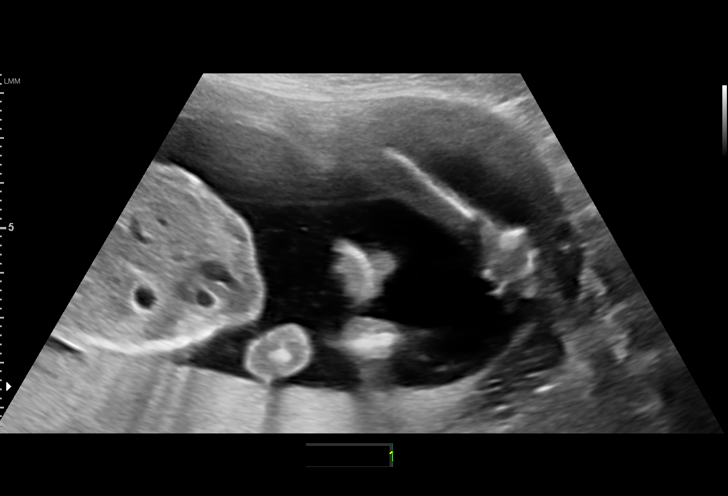
[im 19/72]
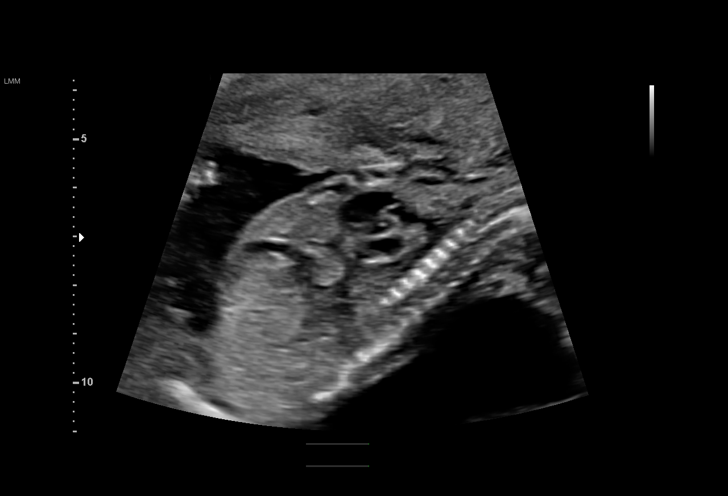
[im 24/72]
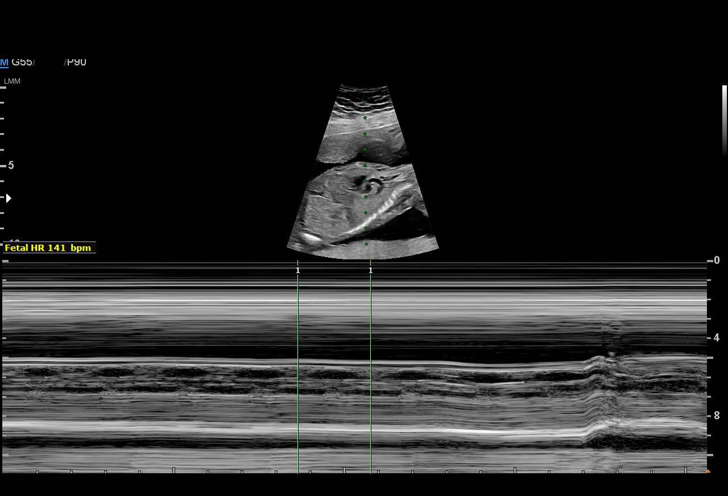
[im 29/72]
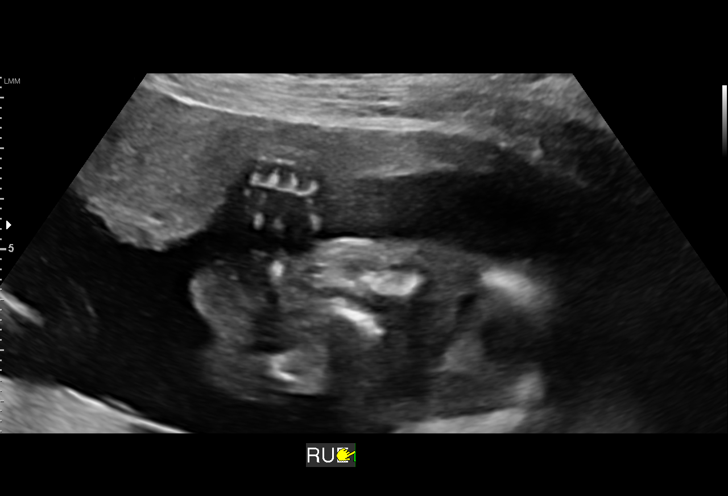
[im 37/72]
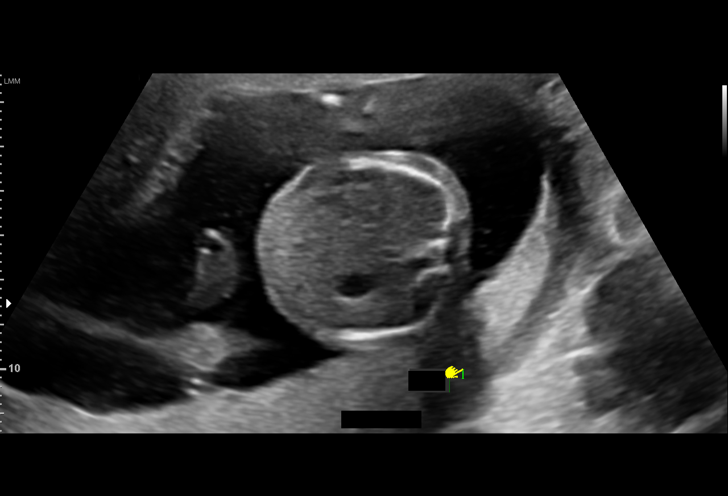
[im 43/72]
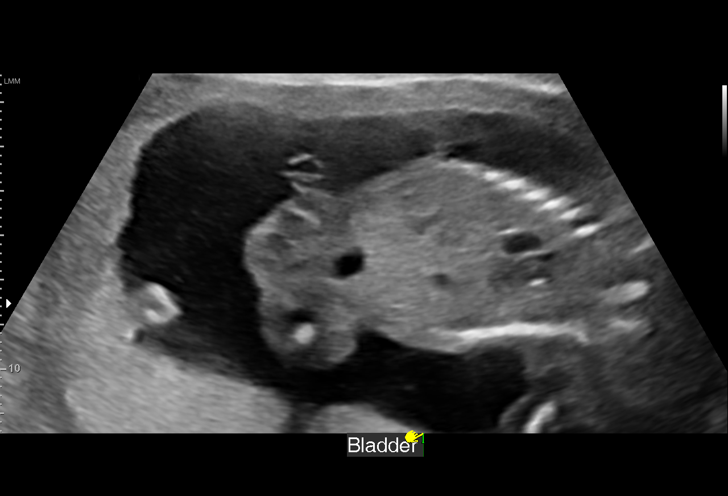
[im 48/72]
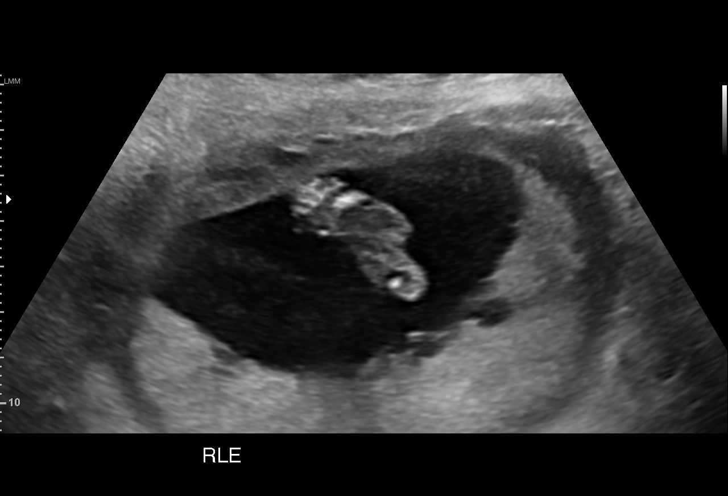
[im 53/72]
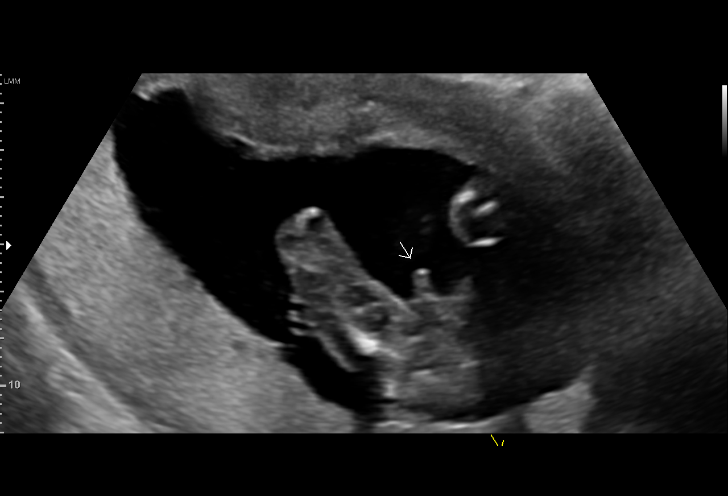
[im 58/72]
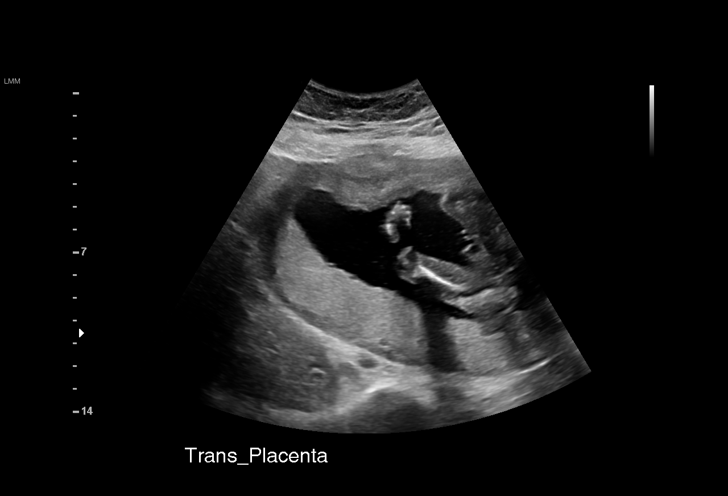
[im 64/72]
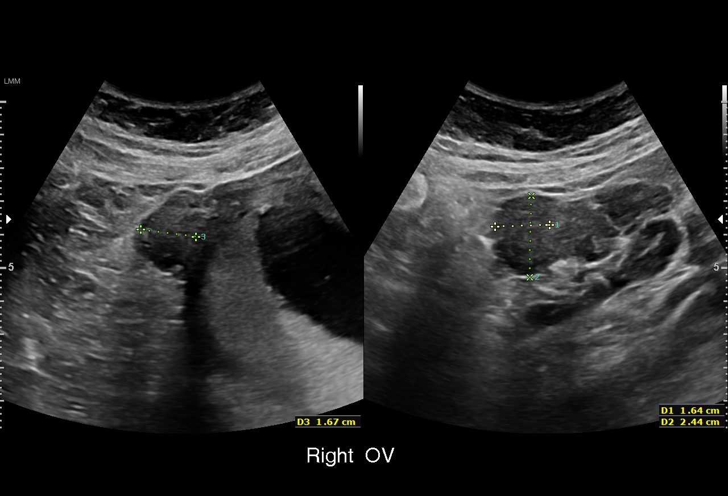
[im 69/72]
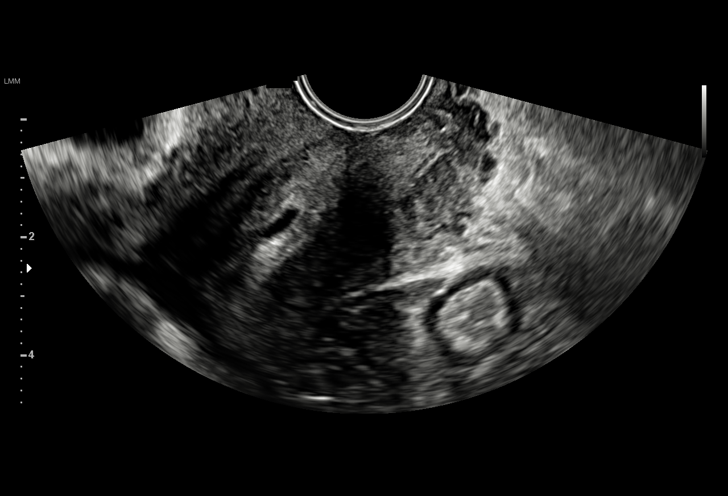

[13 of 28 positions shown; findings below may reference images not displayed]

OB/Gyn Clinic
[REDACTED]

1  POLIN BILLIOT            [id number]      [id number]     [id number]
2  SHEKA BANNING          [id number]      [id number]     [id number]
Indications

19 weeks gestation of pregnancy
Encounter for fetal anatomic survey
Obesity complicating pregnancy, second
trimester
Other abnormal finding on antenatal
screening of mother; advanced paternal age
Poor obstetric history: Previous midtrimester
loss at 22 weeks
OB History

Gravidity:    8         Term:   4        Prem:   1        SAB:   1
TOP:          1       Ectopic:  0        Living: 5
Fetal Evaluation

Num Of Fetuses:     1
Fetal Heart         141
Rate(bpm):
Cardiac Activity:   Observed
Presentation:       Cephalic
Placenta:           Posterior, above cervical os
P. Cord Insertion:  Not well visualized

Amniotic Fluid
AFI FV:      Subjectively within normal limits

Largest Pocket(cm)
6.12
Biometry

BPD:      44.4  mm     G. Age:  19w 3d         38  %    CI:        73.93   %   70 - 86
FL/HC:      16.7   %   16.8 -
HC:       164   mm     G. Age:  19w 1d         19  %    HC/AC:      1.13       1.09 -
AC:      145.4  mm     G. Age:  19w 6d         49  %    FL/BPD:     61.7   %
FL:       27.4  mm     G. Age:  18w 3d          7  %    FL/AC:      18.8   %   20 - 24
HUM:        27  mm     G. Age:  18w 4d         23  %
CER:      19.5  mm     G. Age:  18w 5d         26  %
NFT:       3.1  mm

CM:        4.2  mm
Est. FW:     278  gm    0 lb 10 oz      37  %
Gestational Age

LMP:           19w 5d       Date:   07/25/16                 EDD:   05/01/17
U/S Today:     19w 2d                                        EDD:   05/04/17
Best:          19w 5d    Det. By:   LMP  (07/25/16)          EDD:   05/01/17
Anatomy

Cranium:               Appears normal         Aortic Arch:            Appears normal
Cavum:                 Appears normal         Ductal Arch:            Appears normal
Ventricles:            Appears normal         Diaphragm:              Appears normal
Choroid Plexus:        Appears normal         Stomach:                Appears normal, left
sided
Cerebellum:            Appears normal         Abdomen:                Appears normal
Posterior Fossa:       Appears normal         Abdominal Wall:         Appears nml (cord
insert, abd wall)
Nuchal Fold:           Appears normal         Cord Vessels:           Appears normal (3
vessel cord)
Face:                  Appears normal         Kidneys:                Appear normal
(orbits and profile)
Lips:                  Appears normal         Bladder:                Appears normal
Thoracic:              Appears normal         Spine:                  Not well visualized
Heart:                 Appears normal         Upper Extremities:      Appears normal
(4CH, axis, and situs
RVOT:                  Appears normal         Lower Extremities:      Appears normal
LVOT:                  Appears normal

Other:  Fetus appears to be a male. Heels and 5th digit appears normal.
Technically difficult due to fetal position.
Cervix Uterus Adnexa

Cervix
Length:            3.6  cm.
Measured transvaginally.

Uterus
No abnormality visualized.
Left Ovary
Within normal limits.

Right Ovary
Within normal limits.

Cul De Sac:   No free fluid seen.

Adnexa:       No abnormality visualized.
Impression

SIUP at 19+5 weeks
Normal detailed fetal anatomy; limited views of spine
Markers of aneuploidy: none
Normal amniotic fluid volume
Measurements consistent with LMP dating
EV views of cervix: normal length without funneling
Recommendations

CL in 1, 3 and 5 weeks
Follow-up ultrasound in 5 weeks to complete anatomy survey
Ms. Freiler declined genetic counseling for advanced
paternal age

## 2018-11-19 IMAGING — US US MFM FETAL BPP W/O NON-STRESS
1 series · 15 of 28 positions shown · non-contrast
Comparison: none

[Series 1: us mfm fetal bpp w/o non-stress · 37 acquisitions, 15 frames shown]
[im 1/37]
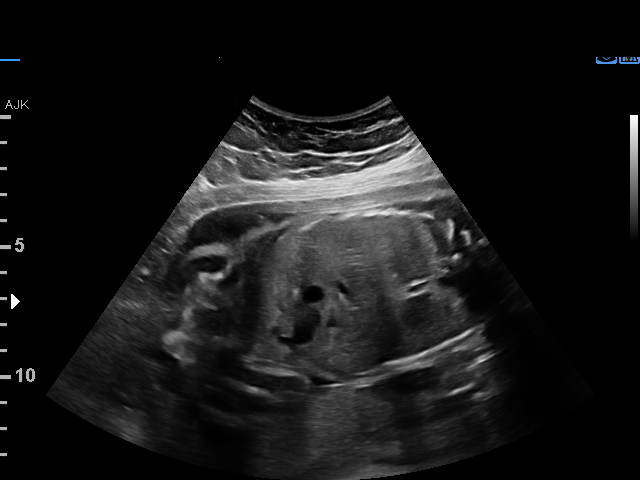
[im 3/37]
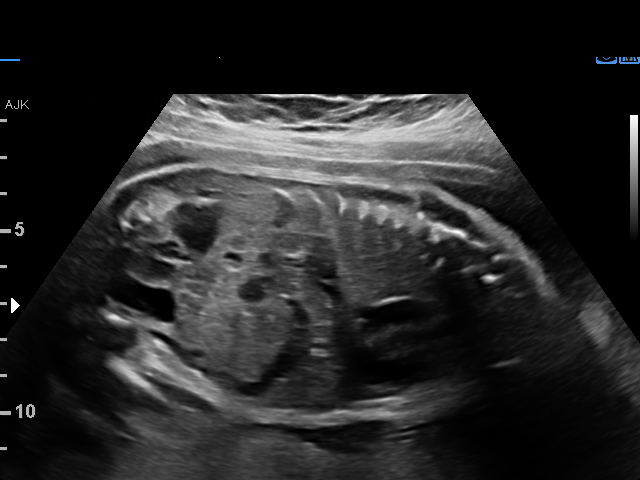
[im 6/37]
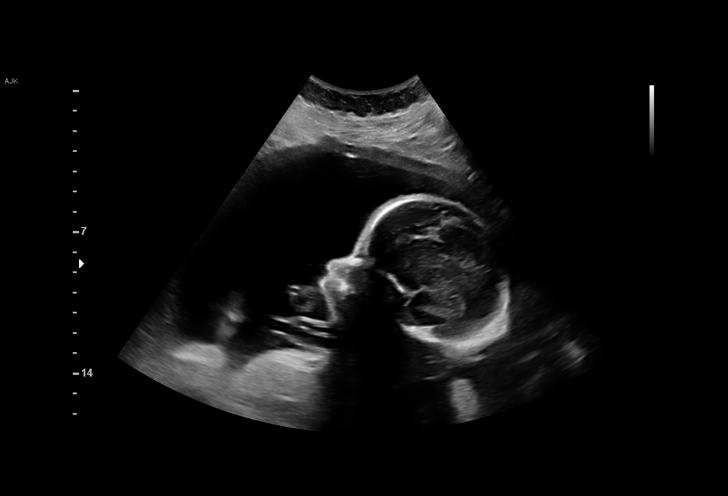
[im 9/37]
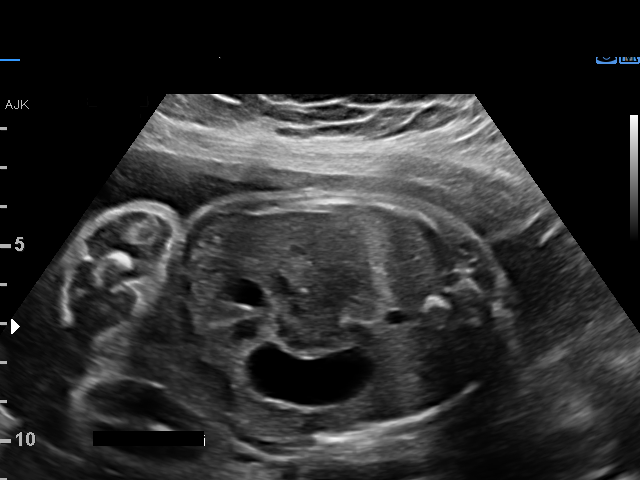
[im 11/37]
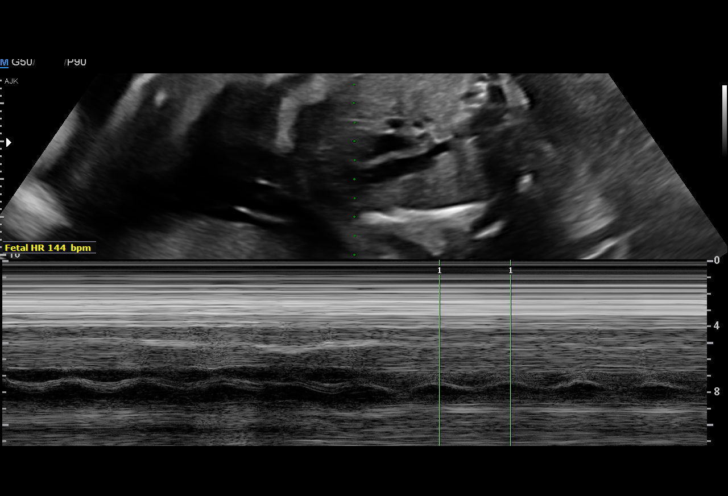
[im 14/37]
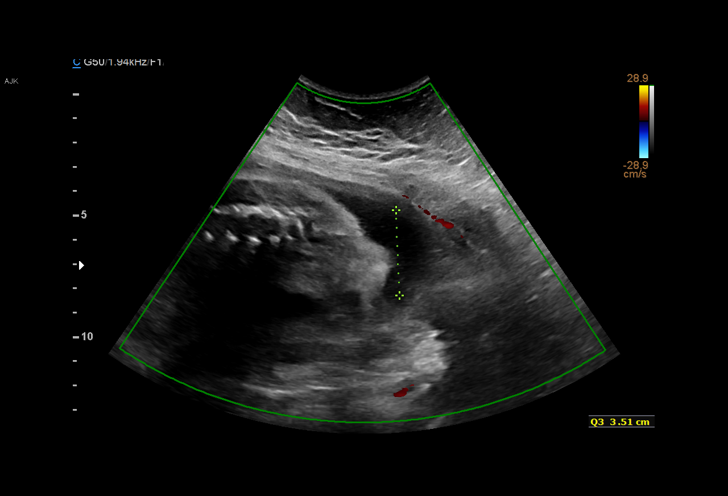
[im 17/37]
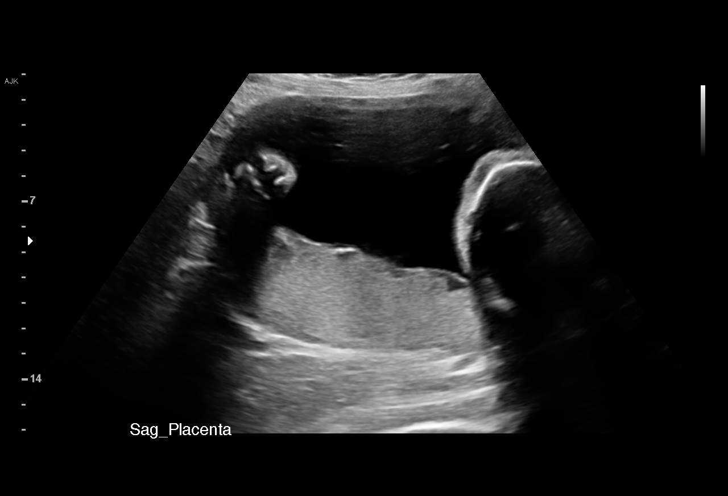
[im 19/37]
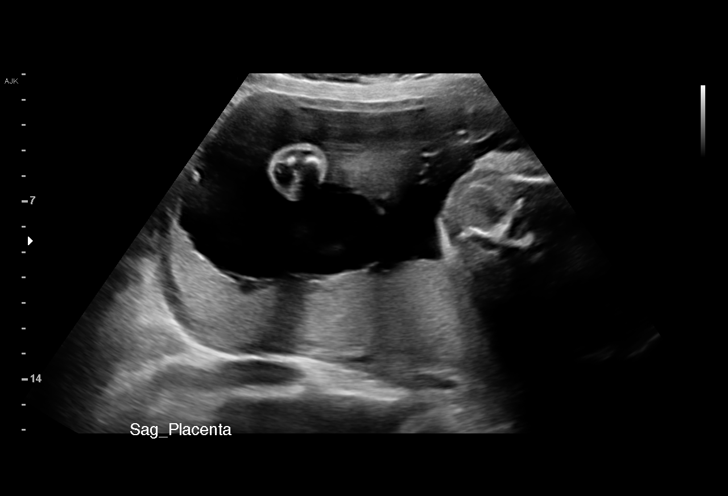
[im 21/37]
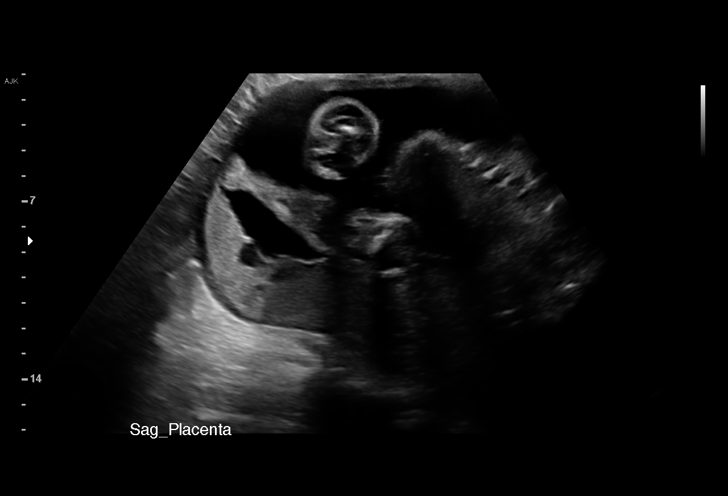
[im 23/37]
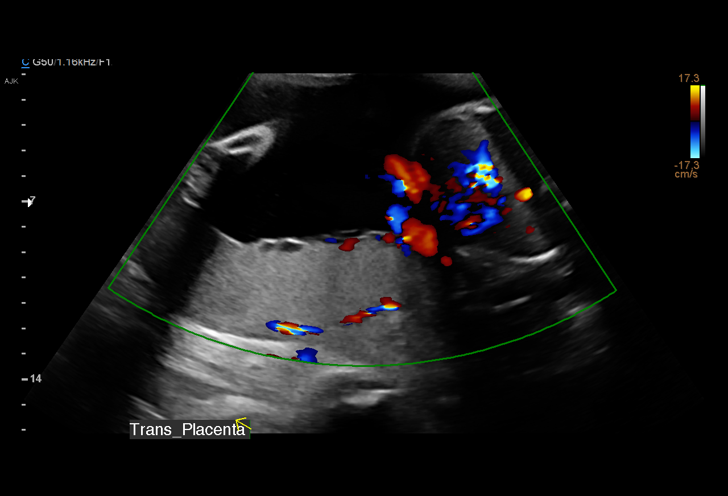
[im 26/37]
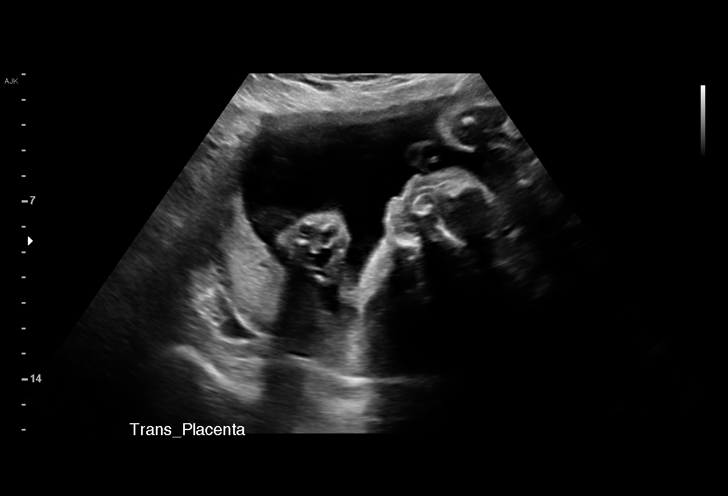
[im 29/37]
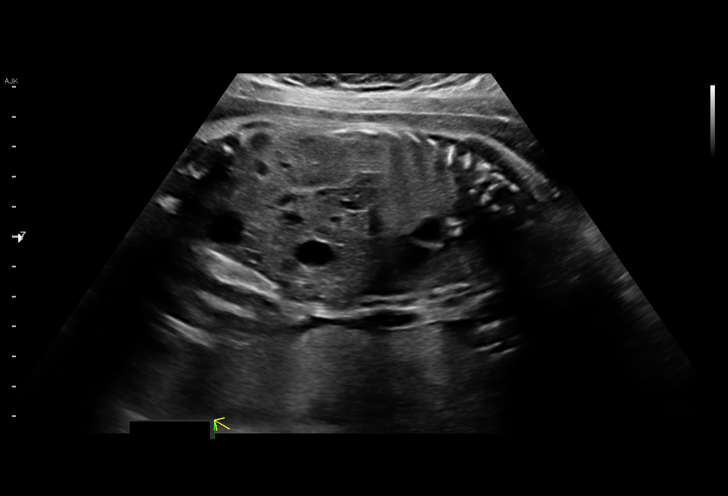
[im 31/37]
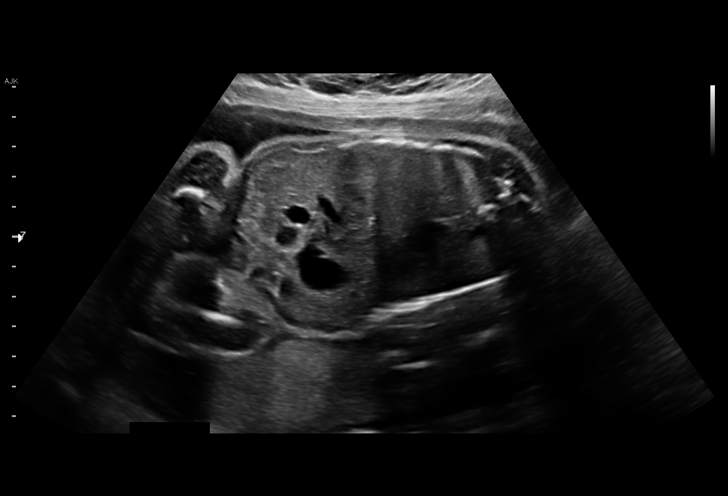
[im 34/37]
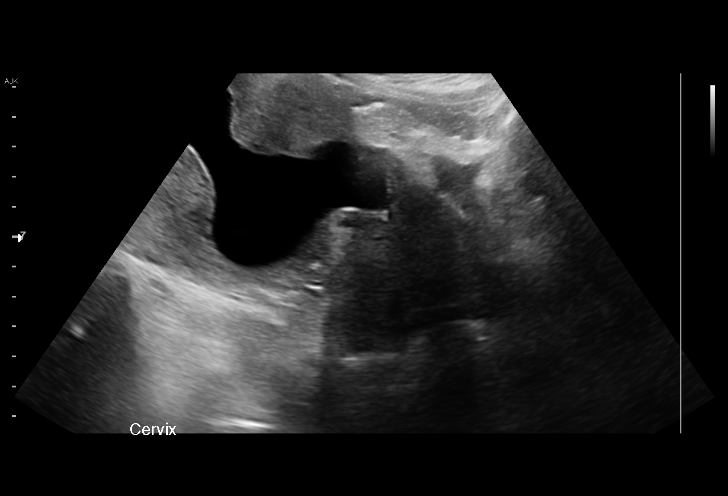
[im 37/37]
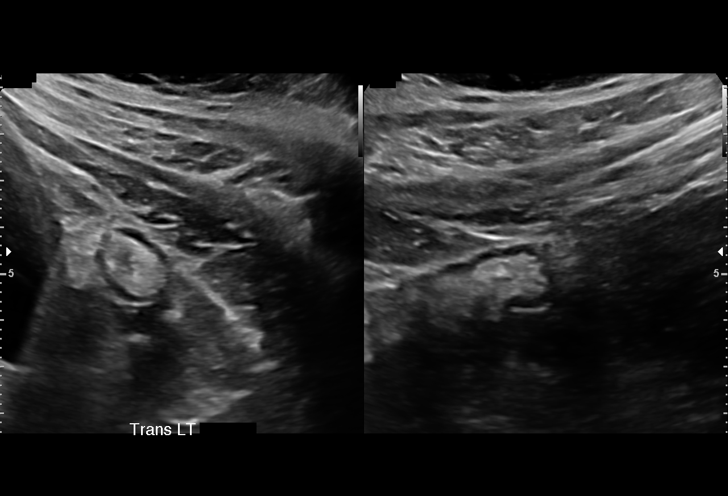

[15 of 28 positions shown; findings below may reference images not displayed]

OB/Gyn Clinic
[REDACTED]

Indications

28 weeks gestation of pregnancy
Fetal heart rate decelerations affecting
management of mother
OB History

Gravidity:    8         Term:   4        Prem:   1         SAB:   1
TOP:          1       Ectopic:  0        Living: 5
Fetal Evaluation

Num Of Fetuses:     1
Fetal Heart         144
Rate(bpm):
Cardiac Activity:   Observed
Presentation:       Cephalic
Placenta:           Posterior, above cervical os
P. Cord Insertion:  Visualized, central

Amniotic Fluid
AFI FV:      Subjectively within normal limits

AFI Sum(cm)     %Tile       Largest Pocket(cm)
17.83           68
RUQ(cm)       RLQ(cm)       LUQ(cm)        LLQ(cm)
5.62          4
Biophysical Evaluation

Amniotic F.V:   Within normal limits       F. Tone:         Observed
F. Movement:    Observed                   Score:           [DATE]
F. Breathing:   Observed
Gestational Age

LMP:           28w 4d        Date:  07/25/16                 EDD:    05/01/17
Best:          28w 4d     Det. By:  LMP  (07/25/16)          EDD:    05/01/17
Cervix Uterus Adnexa

Cervix
Length:           3.52  cm.
Normal appearance by transabdominal scan.

Uterus
No abnormality visualized.

Left Ovary
Not visualized.

Right Ovary
Within normal limits.

Adnexa:       No abnormality visualized.

Comment:      CTX present in LUS.
Impression

SIUP at 28+4 weeks
Normal amniotic fluid volume
BPP [DATE]
Recommendations

Follow-up as clinically indicated

## 2018-12-22 ENCOUNTER — Other Ambulatory Visit: Payer: Self-pay

## 2018-12-22 ENCOUNTER — Ambulatory Visit (HOSPITAL_COMMUNITY)
Admission: EM | Admit: 2018-12-22 | Discharge: 2018-12-22 | Disposition: A | Discharge status: Home / Self Care | Payer: Medicaid Other | Attending: Internal Medicine | Admitting: Internal Medicine

## 2018-12-22 ENCOUNTER — Encounter (HOSPITAL_COMMUNITY): Payer: Self-pay

## 2018-12-22 DIAGNOSIS — R51 Headache: Secondary | ICD-10-CM

## 2018-12-22 DIAGNOSIS — R519 Headache, unspecified: Secondary | ICD-10-CM

## 2018-12-22 MED ORDER — ONDANSETRON 4 MG PO TBDP
ORAL_TABLET | ORAL | Status: AC
  Filled 2018-12-22: qty 1

## 2018-12-22 MED ORDER — DIPHENHYDRAMINE HCL 25 MG PO CAPS
25.0000 mg | ORAL_CAPSULE | Freq: Once | ORAL | Status: AC
  Administered 2018-12-22: 11:00:00 25 mg via ORAL

## 2018-12-22 MED ORDER — KETOROLAC TROMETHAMINE 60 MG/2ML IM SOLN
INTRAMUSCULAR | Status: AC
  Filled 2018-12-22: qty 2

## 2018-12-22 MED ORDER — ASPIRIN-ACETAMINOPHEN-CAFFEINE 250-250-65 MG PO TABS: 1.0000 | ORAL_TABLET | Freq: Four times a day (QID) | ORAL | 0 refills | Status: DC | PRN

## 2018-12-22 MED ORDER — ONDANSETRON 4 MG PO TBDP
4.0000 mg | ORAL_TABLET | Freq: Once | ORAL | Status: AC
  Administered 2018-12-22: 11:00:00 4 mg via ORAL

## 2018-12-22 MED ORDER — ONDANSETRON 4 MG PO TBDP: 4.0000 mg | ORAL_TABLET | Freq: Three times a day (TID) | ORAL | 0 refills | Status: DC | PRN

## 2018-12-22 MED ORDER — KETOROLAC TROMETHAMINE 60 MG/2ML IM SOLN
60.0000 mg | Freq: Once | INTRAMUSCULAR | Status: AC
  Administered 2018-12-22: 11:00:00 60 mg via INTRAMUSCULAR

## 2018-12-22 MED ORDER — DIPHENHYDRAMINE HCL 25 MG PO CAPS
ORAL_CAPSULE | ORAL | Status: AC
  Filled 2018-12-22: qty 1

## 2018-12-22 NOTE — ED Triage Notes (Signed)
Pt presents with recurrent migraine that makes her nauseas and sensitive to light for past 2 months.

## 2018-12-22 NOTE — ED Provider Notes (Signed)
[facility]    CSN: [id number] Arrival date & time: 12/22/18  6286     History   Chief Complaint Chief Complaint  Patient presents with  . Migraine    HPI Doris King is a 33 y.o. female.   Doris King presents with complaints of migraine headache which has been ongoing for the past 2-3 months. Can wax and wane. Light sensitive. Sometimes with nausea. No vision changes. No dizziness, weakness, confusion. No head injury. Pain is primarily frontal headache but can shoot behind bilateral ears. Pain 6/10 currently.  Prior to this onset patient has had migraines intermittently, but this is worse than usual for her. No neck pain. No fevers. Has not been evaluated for this in the past. Has been taking OTC medications which haven't helped. Last was Tylenol this morning at 0630 which didn't help. No current nausea or vomiting. Her BF drove her today. Hx of bipolar, depression.     ROS per HPI, negative if not otherwise mentioned.      Past Medical History:  Diagnosis Date  . Anemia   . Anxiety   . Bipolar affective disorder (HCC)   . Depression     Patient Active Problem List   Diagnosis Date Noted  . GDM, class A1 05/01/2017  . Glucose tolerance test abnormal 04/11/2017  . Unwanted fertility 04/05/2017  . GERD (gastroesophageal reflux disease) 02/28/2017  . Cervical dysplasia 11/29/2016  . History of cesarean delivery 11/29/2016  . Severe obesity (BMI 35.0-39.9) 11/29/2016  . Obesity in pregnancy 11/29/2016  . History of incarceration 11/29/2016  . Marijuana abuse 11/29/2016  . Tobacco abuse 11/29/2016  . History of preterm delivery, currently pregnant in second trimester 11/29/2016  . Late prenatal care 11/29/2016  . Depression, anxiety, bipolar 11/29/2016  . History of pyelonephritis 11/29/2016  . Supervision of high risk pregnancy, antepartum 11/29/2016  . History of placenta abruption 11/29/2016    Past Surgical History:  Procedure Laterality  Date  . CESAREAN SECTION  2016  . DILATION AND CURETTAGE OF UTERUS     EAB at 18wks  . TUBAL LIGATION Bilateral 05/01/2017   Procedure: POST PARTUM TUBAL LIGATION;  Surgeon: Reva Bores, MD;  Location: [facility];  Service: Gynecology;  Laterality: Bilateral;    OB History    Gravida  8   Para  7   Term  5   Preterm  2   AB  1   Living  6     SAB  0   TAB  1   Ectopic  0   Multiple  0   Live Births  6        Obstetric Comments  G1: 2006. TSVD 6lbs 3oz. No issues G2: 2007. TSVD. 6lbs 3oz. Multiple pyelo admits G3: 2010. PPROM at 35-36wks. SVD. 5lbs 8oz G4: 2011. EAB at 18wks  G5: 2004. TSVD 6lbs 3oz G6: 2013 urgent c/s for what sounds like abruption. Term. Pt never told she  couldn't labor in the future. 6lbs G7: 2017 SVD. Pt was incarcerated and felt like she had to go to bathroom and then delivered. Didn't have to go to hospital. She states that they took her to an OB office in [facility] and they removed some "placenta "         Home Medications    Prior to Admission medications   Medication Sig Start Date End Date Taking? Authorizing Provider  acetaminophen (TYLENOL) 325 MG tablet Take 325-650 mg by mouth every 6 (  six) hours as needed (for headaches).     [provider]  aspirin EC 325 MG tablet Take 325-975 mg by mouth daily as needed (for headaches).    [provider]  aspirin-acetaminophen-caffeine (EXCEDRIN MIGRAINE) [phone] MG tablet Take 1 tablet by mouth every 6 (six) hours as needed for headache or migraine. 12/22/18   Georgetta Haber, NP  Cyanocobalamin (VITAMIN B-12 PO) Take 1 tablet by mouth 2 (two) times daily as needed ("for energy").    [provider]  ferrous sulfate 325 (65 FE) MG tablet Take 325 mg by mouth daily with breakfast.    [provider]  naproxen (NAPROSYN) 500 MG tablet Take 1 tablet (500 mg total) by mouth 2 (two) times daily. 08/23/17   Kellie Shropshire, PA-C  ondansetron  (ZOFRAN-ODT) 4 MG disintegrating tablet Take 1 tablet (4 mg total) by mouth every 8 (eight) hours as needed for nausea or vomiting. 12/22/18   Georgetta Haber, NP    Family History History reviewed. No pertinent family history.  Social History Social History   Tobacco Use  . Smoking status: Former Smoker    Types: Cigarettes    Last attempt to quit: 10/06/2016    Years since quitting: 2.2  . Smokeless tobacco: Never Used  Substance Use Topics  . Alcohol use: No  . Drug use: No    Comment: positve THC during pregnancy     Allergies   Patient has no known allergies.   Review of Systems Review of Systems   Physical Exam Triage Vital Signs ED Triage Vitals  Enc Vitals Group     BP 12/22/18 1020 99/61     Pulse Rate 12/22/18 1020 86     Resp 12/22/18 1020 18     Temp 12/22/18 1020 98.7 F (37.1 C)     Temp Source 12/22/18 1020 Oral     SpO2 12/22/18 1020 95 %     Weight --      Height --      Head Circumference --      Peak Flow --      Pain Score 12/22/18 1022 7     Pain Loc --      Pain Edu? --      Excl. in GC? --    No data found.  Updated Vital Signs BP 99/61 (BP Location: Left Arm)   Pulse 86   Temp 98.7 F (37.1 C) (Oral)   Resp 18   SpO2 95%    Physical Exam Constitutional:      General: She is not in acute distress.    Appearance: She is well-developed.  Cardiovascular:     Rate and Rhythm: Normal rate and regular rhythm.     Heart sounds: Normal heart sounds.  Pulmonary:     Effort: Pulmonary effort is normal.     Breath sounds: Normal breath sounds.  Skin:    General: Skin is warm and dry.  Neurological:     General: No focal deficit present.     Mental Status: She is alert and oriented to person, place, and time.     Cranial Nerves: Cranial nerves are intact.     Sensory: Sensation is intact.     Motor: Motor function is intact.     Coordination: Coordination is intact.     Gait: Gait is intact.      UC Treatments / Results   Labs (all labs ordered are listed, but only abnormal results are  displayed) Labs Reviewed - No data to display  EKG None  Radiology No results found.  Procedures Procedures (including critical care time)  Medications Ordered in UC Medications  ondansetron (ZOFRAN-ODT) disintegrating tablet 4 mg (4 mg Oral Given 12/22/18 1102)  ketorolac (TORADOL) injection 60 mg (60 mg Intramuscular Given 12/22/18 1102)  diphenhydrAMINE (BENADRYL) capsule 25 mg (25 mg Oral Given 12/22/18 1103)    Initial Impression / Assessment and Plan / UC Course  I have reviewed the triage vital signs and the nursing notes.  Pertinent labs & imaging results that were available during my care of the patient were reviewed by me and considered in my medical decision making (see chart for details).     Non toxic. No neuro deficits. Migraine cocktail provided. Encouraged follow up with PCP as needed if persistent as may need further eval and treatment. Return precautions provided. Patient verbalized understanding and agreeable to plan.    Final Clinical Impressions(s) / UC Diagnoses   Final diagnoses:  Bad headache     Discharge Instructions     Drink plenty of fluids to ensure adequate hydration.  Monitor your diet as different dietary components can trigger migraines.  Go home and sleep in dark room, no screen time.  Please follow up with a primary care provider as needed for recurrent or persistent symptoms.  Don't take any further ibuprofen or naproxen until tomorrow. May try Excedrin migraine as needed, don't take this for another 6 hours.    ED Prescriptions    Medication Sig Dispense Auth. Provider   ondansetron (ZOFRAN-ODT) 4 MG disintegrating tablet Take 1 tablet (4 mg total) by mouth every 8 (eight) hours as needed for nausea or vomiting. 12 tablet Linus Mako B, NP   aspirin-acetaminophen-caffeine (EXCEDRIN MIGRAINE) [phone] MG tablet Take 1 tablet by mouth every 6 (six) hours as needed  for headache or migraine. 30 tablet Georgetta Haber, NP     Controlled Substance Prescriptions [place] Controlled Substance Registry consulted? Not Applicable   Georgetta Haber, NP 12/22/18 1141

## 2018-12-22 NOTE — Discharge Instructions (Signed)
Drink plenty of fluids to ensure adequate hydration.  Monitor your diet as different dietary components can trigger migraines.  Go home and sleep in dark room, no screen time.  Please follow up with a primary care provider as needed for recurrent or persistent symptoms.  Don't take any further ibuprofen or naproxen until tomorrow. May try Excedrin migraine as needed, don't take this for another 6 hours.

## 2019-01-10 ENCOUNTER — Encounter: Payer: Self-pay | Admitting: Critical Care Medicine

## 2019-01-10 ENCOUNTER — Ambulatory Visit: Payer: Medicaid Other | Attending: Critical Care Medicine | Admitting: Critical Care Medicine

## 2019-01-10 ENCOUNTER — Other Ambulatory Visit: Payer: Self-pay

## 2019-01-10 DIAGNOSIS — G43519 Persistent migraine aura without cerebral infarction, intractable, without status migrainosus: Secondary | ICD-10-CM

## 2019-01-10 MED ORDER — NAPROXEN 500 MG PO TABS: 500.0000 mg | ORAL_TABLET | Freq: Two times a day (BID) | ORAL | 1 refills | Status: DC

## 2019-01-10 MED ORDER — SUMATRIPTAN SUCCINATE 50 MG PO TABS: 50.0000 mg | ORAL_TABLET | ORAL | 0 refills | Status: DC | PRN

## 2019-01-10 MED ORDER — ONDANSETRON 4 MG PO TBDP: 4.0000 mg | ORAL_TABLET | Freq: Three times a day (TID) | ORAL | 0 refills | Status: DC | PRN

## 2019-01-10 NOTE — Progress Notes (Signed)
Subjective:    Patient ID: Doris King, female    DOB: 03/11/1986, 33 y.o.   MRN: [id number] Virtual Visit via Telephone Note  I connected with Doris King on 01/10/19 at 11:00 AM EDT by telephone and verified that I am speaking with the correct person using two identifiers.   Consent:  I discussed the limitations, risks, security and privacy concerns of performing an evaluation and management service by telephone and the availability of in person appointments. I also discussed with the patient that there may be a patient responsible charge related to this service. The patient expressed understanding and agreed to proceed.  Location of patient: The patient is at home Location of provider: I was in my office Persons participating in the televisit with the patient.   The patient was by herself    History of Present Illness: This is a 33 year old female who is had lifelong migraine headaches worse over the past month.  The headaches begin in the frontal area and will radiate to the back.  There is no visual change in the headaches occur however the patient has severe nausea.  The pain is anywhere from 5-7 out of 10.  The Zofran helps the nausea.  The patient is now postpartum.  The patient does not have peripheral motor weakness or change in that status.  The patient was seen in the urgent care facility on May 22 and was prescribed Naprosyn but she is now out of that medication at this time.  She is also out of the Zofran.  Constitutional:   No  weight loss, night sweats,  Fevers, chills, fatigue, lassitude. HEENT:   headaches,  Difficulty swallowing,  Tooth/dental problems,  Sore throat,                No sneezing, itching, ear ache, nasal congestion, post nasal drip,   CV:  No chest pain,  Orthopnea, PND, swelling in lower extremities, anasarca, dizziness, palpitations  GI  heartburn, indigestion, abdominal pain, nausea, vomiting, diarrhea, change in bowel habits, loss of  appetite  Resp: No shortness of breath with exertion or at rest.  No excess mucus, no productive cough,  No non-productive cough,  No coughing up of blood.  No change in color of mucus.  No wheezing.  No chest wall deformity  Skin: no rash or lesions.  GU: no dysuria, change in color of urine, no urgency or frequency.  No flank pain.  MS:  No joint pain or swelling.  No decreased range of motion.  No back pain.  Psych:  No change in mood or affect. No depression or anxiety.  No memory loss.    Observations/Objective: No observations as this was a telephone visit  Assessment and Plan:   Follow Up Instructions: #1 the patient symptom complex is most consistent with acute migraine syndrome  Plan will be to prescribe Imitrex to take 50 mg and a repeat within 2 hours for total dose of 100 mg once daily and as well begin Naprosyn 500 mg twice daily  The plan will also be to refer patient to neurology for further evaluation  We will need to also establish primary care for this patient in the clinic  I discussed the assessment and treatment plan with the patient. The patient was provided an opportunity to ask questions and all were answered. The patient agreed with the plan and demonstrated an understanding of the instructions.   The patient was advised to call back or seek an in-person  evaluation if the symptoms worsen or if the condition fails to improve as anticipated.  I provided 20 minutes of non-face-to-face time during this encounter  including  median intraservice time , review of notes, labs, imaging, medications  and explaining diagnosis and management to the patient .    Asencion Noble, MD

## 2019-01-10 NOTE — Progress Notes (Signed)
Patient verified DOB Patient has taken medication. Patient has eaten today. Patient went to sleep last night with a migraine and woke up with it present. Patient took excedrin. Pain is located in the back of the head and radiates to the front like a band. Pain was scaled at a 5 with minimal relief with medications. Patient complains of nausea with migraines. Patient request zofran refill. Patient is requesting an aide to help maneuver her depression and anxiety.

## 2019-01-18 ENCOUNTER — Telehealth: Payer: Self-pay | Admitting: Critical Care Medicine

## 2019-01-18 NOTE — Telephone Encounter (Signed)
New Message   Pt states that she is having constipation and stomach pain with the medication Dr. Joya Gaskins prescribed, SUMAtriptan (IMITREX) 50 MG tablet. Please f/u

## 2019-01-18 NOTE — Telephone Encounter (Signed)
Patient states she is having a medication reaction please follow up.

## 2019-01-18 NOTE — Telephone Encounter (Signed)
Patients call taken.  Patient identified by name and date of birth.  Patient called and stated that the medication (Imatrex) given for her migraines was making her having nausea, vomiting and abdominal cramps.  Patient advised to stop medications and PCP would be notified and when new information was available she would be called.  Patient acknowledged understanding of advice.

## 2019-01-19 ENCOUNTER — Other Ambulatory Visit: Payer: Self-pay

## 2019-01-19 ENCOUNTER — Encounter (HOSPITAL_COMMUNITY): Payer: Self-pay

## 2019-01-19 ENCOUNTER — Emergency Department (HOSPITAL_COMMUNITY)
Admission: EM | Admit: 2019-01-19 | Discharge: 2019-01-19 | Disposition: A | Discharge status: Home / Self Care | Payer: Medicaid Other | Attending: Emergency Medicine | Admitting: Emergency Medicine

## 2019-01-19 DIAGNOSIS — R109 Unspecified abdominal pain: Secondary | ICD-10-CM | POA: Diagnosis not present

## 2019-01-19 DIAGNOSIS — R51 Headache: Secondary | ICD-10-CM | POA: Insufficient documentation

## 2019-01-19 DIAGNOSIS — Z79899 Other long term (current) drug therapy: Secondary | ICD-10-CM | POA: Insufficient documentation

## 2019-01-19 DIAGNOSIS — F17210 Nicotine dependence, cigarettes, uncomplicated: Secondary | ICD-10-CM | POA: Insufficient documentation

## 2019-01-19 DIAGNOSIS — R519 Headache, unspecified: Secondary | ICD-10-CM

## 2019-01-19 LAB — COMPREHENSIVE METABOLIC PANEL
ALT: 20 U/L (ref 0–44)
AST: 17 U/L (ref 15–41)
Albumin: 4.1 g/dL (ref 3.5–5.0)
Alkaline Phosphatase: 72 U/L (ref 38–126)
Anion gap: 10 (ref 5–15)
BUN: 16 mg/dL (ref 6–20)
CO2: 22 mmol/L (ref 22–32)
Calcium: 9.2 mg/dL (ref 8.9–10.3)
Chloride: 106 mmol/L (ref 98–111)
Creatinine, Ser: 0.85 mg/dL (ref 0.44–1.00)
GFR calc Af Amer: >60 mL/min (ref >60)
GFR calc non Af Amer: >60 mL/min (ref >60)
Glucose, Bld: 104 mg/dL — ABNORMAL HIGH (ref 70–99)
Potassium: 4.2 mmol/L (ref 3.5–5.1)
Sodium: 138 mmol/L (ref 135–145)
Total Bilirubin: 0.6 mg/dL (ref 0.3–1.2)
Total Protein: 6.5 g/dL (ref 6.5–8.1)

## 2019-01-19 LAB — URINALYSIS, ROUTINE W REFLEX MICROSCOPIC
Bacteria, UA: NONE SEEN
Bilirubin Urine: NEGATIVE
Glucose, UA: NEGATIVE mg/dL
Ketones, ur: NEGATIVE mg/dL
Leukocytes,Ua: NEGATIVE
Nitrite: NEGATIVE
Protein, ur: NEGATIVE mg/dL
RBC / HPF: >50 RBC/hpf — ABNORMAL HIGH (ref 0–5)
Specific Gravity, Urine: 1.027 (ref 1.005–1.030)
pH: 5 (ref 5.0–8.0)

## 2019-01-19 LAB — CBC
HCT: 44 % (ref 36.0–46.0)
Hemoglobin: 14.4 g/dL (ref 12.0–15.0)
MCH: 27.7 pg (ref 26.0–34.0)
MCHC: 32.7 g/dL (ref 30.0–36.0)
MCV: 84.6 fL (ref 80.0–100.0)
Platelets: 267 10*3/uL (ref 150–400)
RBC: 5.2 MIL/uL — ABNORMAL HIGH (ref 3.87–5.11)
RDW: 13.4 % (ref 11.5–15.5)
WBC: 11.1 10*3/uL — ABNORMAL HIGH (ref 4.0–10.5)
nRBC: 0 % (ref 0.0–0.2)

## 2019-01-19 LAB — I-STAT BETA HCG BLOOD, ED (MC, WL, AP ONLY): I-stat hCG, quantitative: <5 m[IU]/mL (ref <5)

## 2019-01-19 LAB — LIPASE, BLOOD: Lipase: 25 U/L (ref 11–51)

## 2019-01-19 MED ORDER — PROCHLORPERAZINE MALEATE 10 MG PO TABS: 10.0000 mg | ORAL_TABLET | Freq: Two times a day (BID) | ORAL | 0 refills | Status: DC | PRN

## 2019-01-19 MED ORDER — PROCHLORPERAZINE EDISYLATE 10 MG/2ML IJ SOLN
10.0000 mg | Freq: Once | INTRAMUSCULAR | Status: AC
  Administered 2019-01-19: 10 mg via INTRAVENOUS
  Filled 2019-01-19: qty 2

## 2019-01-19 MED ORDER — SODIUM CHLORIDE 0.9 % IV BOLUS
1000.0000 mL | Freq: Once | INTRAVENOUS | Status: AC
  Administered 2019-01-19: 11:00:00 1000 mL via INTRAVENOUS

## 2019-01-19 MED ORDER — SODIUM CHLORIDE 0.9% FLUSH
3.0000 mL | Freq: Once | INTRAVENOUS | Status: AC
  Administered 2019-01-19: 3 mL via INTRAVENOUS

## 2019-01-19 NOTE — ED Provider Notes (Addendum)
[facility] Provider Note   CSN: [id number] Arrival date & time: 01/19/19  [id number]    History   Chief Complaint Chief Complaint  Patient presents with  . Abdominal Pain  . Headache    HPI Doris King is a 33 y.o. female.     HPI Patient presents with concern of headache, belly pain. She states that her primary complaint is her headache, though she has had episodes of belly pain as well. Past Medical History:  Diagnosis Date  . Anemia   . Anxiety   . Bipolar affective disorder (HCC)   . Depression     Patient Active Problem List   Diagnosis Date Noted  . GDM, class A1 05/01/2017  . Glucose tolerance test abnormal 04/11/2017  . Unwanted fertility 04/05/2017  . GERD (gastroesophageal reflux disease) 02/28/2017  . Cervical dysplasia 11/29/2016  . History of cesarean delivery 11/29/2016  . Severe obesity (BMI 35.0-39.9) 11/29/2016  . Obesity in pregnancy 11/29/2016  . History of incarceration 11/29/2016  . Marijuana abuse 11/29/2016  . Tobacco abuse 11/29/2016  . History of preterm delivery, currently pregnant in second trimester 11/29/2016  . Late prenatal care 11/29/2016  . Depression, anxiety, bipolar 11/29/2016  . History of pyelonephritis 11/29/2016  . Supervision of high risk pregnancy, antepartum 11/29/2016  . History of placenta abruption 11/29/2016    Past Surgical History:  Procedure Laterality Date  . CESAREAN SECTION  2016  . DILATION AND CURETTAGE OF UTERUS     EAB at 18wks  . TUBAL LIGATION Bilateral 05/01/2017   Procedure: POST PARTUM TUBAL LIGATION;  Surgeon: Reva BoresPratt, Tanya S, MD;  [facility];  Service: Gynecology;  Laterality: Bilateral;     OB History    Gravida  8   Para  7   Term  5   Preterm  2   AB  1   Living  6     SAB  0   TAB  1   Ectopic  0   Multiple  0   Live Births  6        Obstetric Comments  G1: 2006. TSVD 6lbs 3oz. No issues G2: 2007. TSVD. 6lbs  3oz. Multiple pyelo admits G3: 2010. PPROM at 35-36wks. SVD. 5lbs 8oz G4: 2011. EAB at 18wks  G5: 2004. TSVD 6lbs 3oz G6: 2013 urgent c/s for what sounds like abruption. Term. Pt never told she  couldn't labor in the future. 6lbs G7: 2017 SVD. Pt was incarcerated and felt like she had to go to bathroom and then delivered. Didn't have to go to hospital. She states that [place] [facility] [place] "[place]a "         Home Medications    Prior to Admission medications   Medication Sig Start Date End Date Taking? Authorizing Provider  acetaminophen (TYLENOL) 325 MG tablet Take 325-650 mg by mouth every 6 (six) hours as needed (for headaches).    Yes [provider]  aspirin-acetaminophen-caffeine (EXCEDRIN MIGRAINE) [id number] MG tablet Take 1 tablet by mouth every 6 (six) hours as needed for headache or migraine. 12/22/18  Yes Georgetta HaberBurky, Natalie B, NP  Multiple Vitamin (MULTIVITAMIN WITH MINERALS) TABS tablet Take 1 tablet by mouth daily.   Yes [provider]  naproxen (NAPROSYN) 500 MG tablet Take 1 tablet (500 mg total) by mouth 2 (two) times daily. 01/10/19  Yes Storm FriskWright, Patrick E, MD  ondansetron (ZOFRAN-ODT) 4 MG disintegrating  tablet Take 1 tablet (4 mg total) by mouth every 8 (eight) hours as needed for nausea or vomiting. Patient not taking: Reported on 01/19/2019 01/10/19   Storm FriskWright, Patrick E, MD  prochlorperazine (COMPAZINE) 10 MG tablet Take 1 tablet (10 mg total) by mouth 2 (two) times daily as needed for nausea or vomiting. 01/19/19   Gerhard MunchLockwood, Samyria Rudie, MD  SUMAtriptan (IMITREX) 50 MG tablet Take 1 tablet (50 mg total) by mouth every 2 (two) hours as needed for migraine. May repeat in 2 hours if headache persists or recurs. Maximum of two doses in 24 hours Patient not taking: Reported on 01/19/2019 01/10/19   Storm FriskWright, Patrick E, MD    Family History Family History  Problem Relation Age of Onset  . Depression Mother   . Migraines  Father     Social History Social History   Tobacco Use  . Smoking status: Current Every Day Smoker    Types: Cigarettes    Last attempt to quit: 10/06/2016    Years since quitting: 2.2  . Smokeless tobacco: Never Used  . Tobacco comment: 5 cigs/day  Substance Use Topics  . Alcohol use: No  . Drug use: No    Comment: positve THC during pregnancy     Allergies   Imitrex [sumatriptan]   Review of Systems Review of Systems  Constitutional:       Per HPI, otherwise negative  HENT:       Per HPI, otherwise negative  Respiratory:       Per HPI, otherwise negative  Cardiovascular:       Per HPI, otherwise negative  Gastrointestinal: Positive for abdominal pain. Negative for vomiting.  Endocrine:       Negative aside from HPI  Genitourinary:       Neg aside from HPI   Musculoskeletal:       Per HPI, otherwise negative  Skin: Negative.   Neurological: Positive for headaches. Negative for syncope.     Physical Exam Updated Vital Signs BP 115/69 (BP Location: Left Arm)   Pulse 93   Temp 98.1 F (36.7 C) (Oral)   Resp 16   Ht 5\' 4"  (1.626 m)   Wt 89.8 kg   LMP 01/10/2019   SpO2 100%   BMI 33.99 kg/m   Physical Exam Vitals signs and nursing note reviewed.  Constitutional:      General: She is not in acute distress.    Appearance: She is well-developed.  HENT:     Head: Normocephalic and atraumatic.  Eyes:     Conjunctiva/sclera: Conjunctivae normal.  Cardiovascular:     Rate and Rhythm: Normal rate and regular rhythm.  Pulmonary:     Effort: Pulmonary effort is normal. No respiratory distress.     Breath sounds: Normal breath sounds. No stridor.  Abdominal:     General: There is no distension.     Comments: Minimal tenderness to palpation, no peritoneal findings  Skin:    General: Skin is warm and dry.  Neurological:     General: No focal deficit present.     Mental Status: She is alert and oriented to person, place, and time.     Cranial Nerves:  Cranial nerves are intact. No cranial nerve deficit.      ED Treatments / Results  Labs (all labs ordered are listed, but only abnormal results are displayed) Labs Reviewed  COMPREHENSIVE METABOLIC PANEL - Abnormal; Notable for the following components:      Result Value   Glucose,  Bld 104 (*)    All other components within normal limits  CBC - Abnormal; Notable for the following components:   WBC 11.1 (*)    RBC 5.20 (*)    All other components within normal limits  URINALYSIS, ROUTINE W REFLEX MICROSCOPIC - Abnormal; Notable for the following components:   APPearance HAZY (*)    Hgb urine dipstick MODERATE (*)    RBC / HPF >50 (*)    All other components within normal limits  LIPASE, BLOOD  I-STAT BETA HCG BLOOD, ED ([facility], [facility], [facility] ONLY)    EKG None  Radiology No results found.  Procedures Procedures (including critical care time)  Medications Ordered in ED Medications  sodium chloride flush (NS) 0.9 % injection 3 mL (3 mLs Intravenous Given 01/19/19 1038)  sodium chloride 0.9 % bolus 1,000 mL (0 mLs Intravenous Stopped 01/19/19 1143)  prochlorperazine (COMPAZINE) injection 10 mg (10 mg Intravenous Given 01/19/19 1038)     Initial Impression / Assessment and Plan / ED Course  I have reviewed the triage vital signs and the nursing notes.  Pertinent labs & imaging results that were available during my care of the patient were reviewed by me and considered in my medical decision making (see chart for details).        12:19 PM Patient sitting upright smiling, in no distress, awake, alert, no headache. We discussed findings, possibilities of migraine versus trigeminal neuralgia, need for ongoing outpatient monitoring, and given her improvement here with Compazine she will start a similar medication regimen going home. Absent ongoing complaints, no neurologic dysfunction, no head imaging indicated. Similarly, with no evidence for peritonitis, no acute abdomen, no  vomiting, no diarrhea, no fever, abdominal CT not indicated. Patient discharged with close outpatient follow-up.  Final Clinical Impressions(s) / ED Diagnoses   Final diagnoses:  Bad headache    ED Discharge Orders         Ordered    prochlorperazine (COMPAZINE) 10 MG tablet  2 times daily PRN     01/19/19 1218           Carmin Muskrat, MD 01/19/19 1220    Carmin Muskrat, MD 01/27/19 2337

## 2019-01-19 NOTE — ED Triage Notes (Signed)
Pt from home, c/o migraines x 1 month; states she was taking Imitrex, but stopped d/t nausea; also c/o LLQ and RLQ abd pain x 1 week, describes as "a bad braxton-hix; endorses N/V, constipation; last regular BM 1 approx 1 week ago; denies cough, fever, or sick contacts

## 2019-01-19 NOTE — Discharge Instructions (Addendum)
As discussed, your evaluation today has been largely reassuring.  But, it is important that you monitor your condition carefully, and do not hesitate to return to the ED if you develop new, or concerning changes in your condition.  Otherwise, please follow-up with your physician for appropriate ongoing care.  Please be sure to discuss today's visit and considerations for your headache, including migraine or trigeminal neuralgia to ensure appropriate ongoing management.

## 2019-01-19 NOTE — ED Notes (Signed)
Patient verbalizes understanding of discharge instructions. Opportunity for questioning and answers were provided. Pt discharged from ED. 

## 2019-01-21 NOTE — Telephone Encounter (Signed)
I would Stop IMITREX and I would recommend she follow up with neurology evaluation.  I had made a neurology referral for the patient,  Can we follow up on the status of the neurology evaluation

## 2019-01-22 NOTE — Telephone Encounter (Signed)
Patients call returned.  Patient identified by name and date of birth.  Patient given information from Dr. Joya Gaskins.  Patient told to stop Imitrex and that a referral for neurology had been made.  Neurology should call patient and schedule and appointment.     PCP would follow up after neurology evaluation.  Patient acknowledged understanding of advice.

## 2019-02-13 NOTE — Progress Notes (Signed)
Virtual Visit via Video Note The purpose of this virtual visit is to provide medical care while limiting exposure to the novel coronavirus.    Consent was obtained for video visit:  Yes Answered questions that patient had about telehealth interaction:  Yes I discussed the limitations, risks, security and privacy concerns of performing an evaluation and management service by telemedicine. I also discussed with the patient that there may be a patient responsible charge related to this service. The patient expressed understanding and agreed to proceed.  Pt location: Home Physician Location: Home Name of referring provider:  Elsie Stain, MD I connected with Doris King at patients initiation/request on 02/14/2019 at  9:10 AM EDT by video enabled telemedicine application and verified that I am speaking with the correct person using two identifiers. Pt MRN:  [id number] Pt DOB:  Apr 23, 1986 Video Participants:  Dillon Strong   History of Present Illness:  Doris King is a 33 year old woman who presents for migraines.  History supplemented by ED and referring provider notes.  Onset:  Infrequently in her mid-20s.  Migraines have been worse over the past 3 months, requiring an ED visit in May and June.  She always has a constant dull headache with fluctuations.  No preceding event. Location:  Often start from back of head and radiates to front of head, usually right side Quality:  Shooting pain, throbbing Intensity:  6/10 (sometimes worse).  She denies thunderclap headache Premonitory Phase:  none Postdrome:  none Aura:  No Associated symptoms:  Nausea, vomiting, photophobia, phonophobia, bilateral dilated pupils.  She denies associated visual disturbance or unilateral numbness or weakness. Duration:  5 hours Frequency:  Every 3 days Frequency of abortive medication: Takes Excedrin Migraine daily Triggers:  none Relieving factors:  Sometimes Excedrin Migraine Activity:  Difficult  to function when severe.  Current NSAIDS:  naproxen Current analgesics:  Excedrin Migraine Current triptans:  none Current ergotamine:  none Current anti-emetic:  none Current muscle relaxants:  none Current anti-anxiolytic:  none Current sleep aide:  none Current Antihypertensive medications:  none Current Antidepressant medications:  none Current Anticonvulsant medications:  none Current anti-CGRP:  none Current Vitamins/Herbal/Supplements:  MVI Current Antihistamines/Decongestants: none Other therapy:  none Hormone/birth control:  none  Past NSAIDS:  ASA, ibuprofen Past analgesics:  Tylenol Past abortive triptans:  Sumatriptan 50mg  (made her feel sick) Past abortive ergotamine:  none Past muscle relaxants:  Flexeril Past anti-emetic:  Reglan, Zofran ODT 4mg , Compazine 10mg  Past antihypertensive medications:  none Past antidepressant medications:  Many years ago (for depression, cannot remember) Past anticonvulsant medications:  none Past anti-CGRP:  none Past vitamins/Herbal/Supplements:  none Past antihistamines/decongestants:  none Other past therapies:  none  Caffeine:  1 cup of coffee daily, 3 cans Pepsi daily Smoker:  Recently quit Diet:  2 or 3 16oz bottles of water daily Exercise:  Not routine Depression:  sometimes; Anxiety:  yes Other pain:  Chronic back pain Sleep hygiene:  Poor sleep 3-4 hours No history of concussion Family history of headache:  Father (migraines), sister (occipital neuralgia)  01/19/19 LABS:  CBC with WBC 11.1, HGB 14.4, HCT 44, PLT 167; CMP with Na 138, K 4.2, Cl 106, CO2 22, glucose 104, BUN 16, Cr 0.85, t bili 0.6, ALP 72, AST 17, ALT 20.  Past Medical History: Past Medical History:  Diagnosis Date  . Anemia   . Anxiety   . Bipolar affective disorder ([place])   . Depression     Medications: Outpatient Encounter Medications  as of 02/14/2019  Medication Sig  . acetaminophen (TYLENOL) 325 MG tablet Take 325-650 mg by mouth every 6  (six) hours as needed (for headaches).   Marland Kitchen. aspirin-acetaminophen-caffeine (EXCEDRIN MIGRAINE) 250-250-65 MG tablet Take 1 tablet by mouth every 6 (six) hours as needed for headache or migraine.  . Multiple Vitamin (MULTIVITAMIN WITH MINERALS) TABS tablet Take 1 tablet by mouth daily.  . naproxen (NAPROSYN) 500 MG tablet Take 1 tablet (500 mg total) by mouth 2 (two) times daily.  . ondansetron (ZOFRAN-ODT) 4 MG disintegrating tablet Take 1 tablet (4 mg total) by mouth every 8 (eight) hours as needed for nausea or vomiting. (Patient not taking: Reported on 01/19/2019)  . prochlorperazine (COMPAZINE) 10 MG tablet Take 1 tablet (10 mg total) by mouth 2 (two) times daily as needed for nausea or vomiting.  . SUMAtriptan (IMITREX) 50 MG tablet Take 1 tablet (50 mg total) by mouth every 2 (two) hours as needed for migraine. May repeat in 2 hours if headache persists or recurs. Maximum of two doses in 24 hours (Patient not taking: Reported on 01/19/2019)   No facility-administered encounter medications on file as of 02/14/2019.     Allergies: Allergies  Allergen Reactions  . Imitrex [Sumatriptan] Nausea And Vomiting and Other (See Comments)    altered    Family History: Family History  Problem Relation Age of Onset  . Depression Mother   . Migraines Father     Social History: Social History   Socioeconomic History  . Marital status: Single    Spouse name: Not on file  . Number of children: Not on file  . Years of education: Not on file  . Highest education level: Not on file  Occupational History  . Not on file  Social Needs  . Financial resource strain: Not on file  . Food insecurity    Worry: Not on file    Inability: Not on file  . Transportation needs    Medical: Not on file    Non-medical: Not on file  Tobacco Use  . Smoking status: Current Every Day Smoker    Types: Cigarettes    Last attempt to quit: 10/06/2016    Years since quitting: 2.3  . Smokeless tobacco: Never Used   . Tobacco comment: 5 cigs/day  Substance and Sexual Activity  . Alcohol use: No  . Drug use: No    Comment: positve THC during pregnancy  . Sexual activity: Yes    Birth control/protection: None  Lifestyle  . Physical activity    Days per week: Not on file    Minutes per session: Not on file  . Stress: Not on file  Relationships  . Social Musicianconnections    Talks on phone: Not on file    Gets together: Not on file    Attends religious service: Not on file    Active member of club or organization: Not on file    Attends meetings of clubs or organizations: Not on file    Relationship status: Not on file  . Intimate partner violence    Fear of current or ex partner: Not on file    Emotionally abused: Not on file    Physically abused: Not on file    Forced sexual activity: Not on file  Other Topics Concern  . Not on file  Social History Narrative  . Not on file    Observations/Objective:   Height 5\' 4"  (1.626 m), weight 187 lb (84.8 kg), unknown if currently  breastfeeding. No acute distress.  Alert and oriented.  Speech fluent and not dysarthric.  Language intact.  Eyes orthophoric on primary gaze.  Face symmetric.  Assessment and Plan:   1.  Chronic migraine without aura, without status migrainosus, not intractable.  1.  For preventative management, start nortriptyline 10mg  at bedtime for one week, then increase to 20mg  at bedtime.  If headaches not improved in 5 weeks, we can increase to 50mg  at bedtime 2.  For abortive therapy, rizatriptan 10mg  (Zofran 4mg  for nausea) 3. Stop Excedrin Migraine 4.  Limit use of pain relievers to no more than 2 days out of week to prevent risk of rebound or medication-overuse headache. 5.  Keep headache diary 6.  Exercise, hydration, caffeine cessation, sleep hygiene, monitor for and avoid triggers 7.  Consider:  magnesium citrate 400mg  daily, riboflavin 400mg  daily, and coenzyme Q10 100mg  three times daily 8. Always keep in mind that currently  taking a hormone or birth control may be a possible trigger or aggravating factor for migraine. 9.   As these are new daily headaches, will check MRI of brain to evaluate for any secondary intracranial abnormality. 10.  Follow up in 4 months  Follow Up Instructions:    -I discussed the assessment and treatment plan with the patient. The patient was provided an opportunity to ask questions and all were answered. The patient agreed with the plan and demonstrated an understanding of the instructions.   The patient was advised to call back or seek an in-person evaluation if the symptoms worsen or if the condition fails to improve as anticipated.    Cira ServantAdam Robert Shailee Foots, DO

## 2019-02-14 ENCOUNTER — Other Ambulatory Visit: Payer: Self-pay

## 2019-02-14 ENCOUNTER — Telehealth (INDEPENDENT_AMBULATORY_CARE_PROVIDER_SITE_OTHER): Payer: Medicaid Other | Admitting: Neurology

## 2019-02-14 ENCOUNTER — Encounter: Payer: Self-pay | Admitting: Neurology

## 2019-02-14 VITALS — Ht 64.0 in | Wt 187.0 lb

## 2019-02-14 DIAGNOSIS — G43709 Chronic migraine without aura, not intractable, without status migrainosus: Secondary | ICD-10-CM | POA: Diagnosis not present

## 2019-02-14 DIAGNOSIS — R519 Headache, unspecified: Secondary | ICD-10-CM

## 2019-02-14 MED ORDER — RIZATRIPTAN BENZOATE 10 MG PO TABS: ORAL_TABLET | ORAL | 0 refills | Status: DC

## 2019-02-14 MED ORDER — NORTRIPTYLINE HCL 10 MG PO CAPS: ORAL_CAPSULE | ORAL | 0 refills | Status: DC

## 2019-02-14 MED ORDER — ONDANSETRON 4 MG PO TBDP: 4.0000 mg | ORAL_TABLET | Freq: Three times a day (TID) | ORAL | 3 refills | Status: DC | PRN

## 2019-02-14 NOTE — Patient Instructions (Signed)
Migraine Recommendations: 1.  Start nortriptyline 10mg .  Take 1 capsule at bedtime for 7 days, then increase to 2 capsules at bedtime..  Contact me towards end of prescription with update and we can adjust dose if needed. 2.  Take rizatriptan 10mg  at earliest onset of headache.  May repeat dose once in 2 hours if needed.  Do not exceed two tablets in 24 hours.  Use ondansetron for nausea.  STOP EXCEDRIN 3.  Limit use of pain relievers to no more than 2 days out of the week.  These medications include acetaminophen, ibuprofen, triptans and narcotics.  This will help reduce risk of rebound headaches. 4.  Be aware of common food triggers such as processed sweets, processed foods with nitrites (such as deli meat, hot dogs, sausages), foods with MSG, alcohol (such as wine), chocolate, certain cheeses, certain fruits (dried fruits, bananas, pineapple), vinegar, diet soda. 4.  STOP CAFFEINE (COFFEE, PEPSI) 5.  Routine exercise 6.  Proper sleep hygiene 7.  Stay adequately hydrated with water 8.  Keep a headache diary. 9.  Maintain proper stress management. 10.  Do not skip meals. 11.  Consider supplements:  Magnesium citrate 400mg  to 600mg  daily, riboflavin 400mg , Coenzyme Q 10 100mg  three times daily 12.  Will check MRI of brain 13.  Follow up in 4 months

## 2019-02-14 NOTE — Addendum Note (Signed)
Addended by: Clois Comber on: 02/14/2019 10:00 AM   Modules accepted: Orders

## 2019-03-02 ENCOUNTER — Other Ambulatory Visit: Payer: Self-pay

## 2019-03-02 ENCOUNTER — Encounter: Payer: Self-pay | Admitting: Nurse Practitioner

## 2019-03-02 ENCOUNTER — Ambulatory Visit: Payer: Medicaid Other | Attending: Nurse Practitioner | Admitting: Nurse Practitioner

## 2019-03-02 VITALS — Ht 64.0 in | Wt 187.0 lb

## 2019-03-02 DIAGNOSIS — F419 Anxiety disorder, unspecified: Secondary | ICD-10-CM | POA: Diagnosis not present

## 2019-03-02 DIAGNOSIS — Z720 Tobacco use: Secondary | ICD-10-CM

## 2019-03-02 DIAGNOSIS — F3170 Bipolar disorder, currently in remission, most recent episode unspecified: Secondary | ICD-10-CM | POA: Insufficient documentation

## 2019-03-02 DIAGNOSIS — F17210 Nicotine dependence, cigarettes, uncomplicated: Secondary | ICD-10-CM

## 2019-03-02 DIAGNOSIS — G43519 Persistent migraine aura without cerebral infarction, intractable, without status migrainosus: Secondary | ICD-10-CM

## 2019-03-02 DIAGNOSIS — Z818 Family history of other mental and behavioral disorders: Secondary | ICD-10-CM | POA: Insufficient documentation

## 2019-03-02 DIAGNOSIS — F319 Bipolar disorder, unspecified: Secondary | ICD-10-CM

## 2019-03-02 DIAGNOSIS — Z820 Family history of epilepsy and other diseases of the nervous system: Secondary | ICD-10-CM | POA: Insufficient documentation

## 2019-03-02 MED ORDER — HYDROXYZINE HCL 25 MG PO TABS: 25.0000 mg | ORAL_TABLET | Freq: Three times a day (TID) | ORAL | 0 refills | Status: DC | PRN
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
## 2019-03-12 ENCOUNTER — Other Ambulatory Visit: Payer: Self-pay | Admitting: Critical Care Medicine

## 2019-03-16 ENCOUNTER — Other Ambulatory Visit: Payer: Self-pay

## 2019-03-16 MED ORDER — NORTRIPTYLINE HCL 10 MG PO CAPS: ORAL_CAPSULE | ORAL | 3 refills | Status: DC

## 2019-03-16 MED ORDER — RIZATRIPTAN BENZOATE 10 MG PO TABS: ORAL_TABLET | ORAL | 3 refills | Status: DC

## 2019-03-19 ENCOUNTER — Encounter: Payer: Self-pay | Admitting: Nurse Practitioner

## 2019-03-19 ENCOUNTER — Ambulatory Visit: Payer: Medicaid Other | Attending: Nurse Practitioner | Admitting: Nurse Practitioner

## 2019-03-19 ENCOUNTER — Other Ambulatory Visit: Payer: Self-pay

## 2019-03-19 DIAGNOSIS — D649 Anemia, unspecified: Secondary | ICD-10-CM | POA: Diagnosis not present

## 2019-03-19 DIAGNOSIS — Z79899 Other long term (current) drug therapy: Secondary | ICD-10-CM | POA: Insufficient documentation

## 2019-03-19 DIAGNOSIS — F319 Bipolar disorder, unspecified: Secondary | ICD-10-CM | POA: Diagnosis not present

## 2019-03-19 DIAGNOSIS — F17210 Nicotine dependence, cigarettes, uncomplicated: Secondary | ICD-10-CM | POA: Insufficient documentation

## 2019-03-19 DIAGNOSIS — F419 Anxiety disorder, unspecified: Secondary | ICD-10-CM

## 2019-03-19 DIAGNOSIS — G43519 Persistent migraine aura without cerebral infarction, intractable, without status migrainosus: Secondary | ICD-10-CM | POA: Insufficient documentation

## 2019-03-19 MED ORDER — HYDROXYZINE HCL 50 MG PO TABS: 50.0000 mg | ORAL_TABLET | Freq: Three times a day (TID) | ORAL | 0 refills | Status: DC | PRN

## 2019-03-19 NOTE — Progress Notes (Signed)
Virtual Visit via Telephone Note Due to national recommendations of social distancing due to [place] 19, telehealth visit is felt to be most appropriate for this patient at this time.  I discussed the limitations, risks, security and privacy concerns of performing an evaluation and management service by telephone and the availability of in person appointments. I also discussed with the patient that there may be a patient responsible charge related to this service. The patient expressed understanding and agreed to proceed.    I connected with Kaitlynne Schroader on 03/19/19  at   2:30 PM EDT  EDT by telephone and verified that I am speaking with the correct person using two identifiers.   Consent I discussed the limitations, risks, security and privacy concerns of performing an evaluation and management service by telephone and the availability of in person appointments. I also discussed with the patient that there may be a patient responsible charge related to this service. The patient expressed understanding and agreed to proceed.   Location of Patient: Private Residence   Location of Provider: [place] [facility] [employer] [facility]    Persons participating in Telemedicine visit: Geryl Rankins FNP-BC [place]    History of Present Illness: Telemedicine visit for: Anxiety and Depression  has a past medical history of Anemia, Anxiety, Bipolar affective disorder ([place]), Chronic migraine w/o aura w/o status migrainosus, not intractable, and Depression.  She sees neurology for her migraines. Currently taking pamelor and maxalt   Anxiety Currently taking 2 tablets of atarax BID. Will change dosage from 25 mg to 50mg  to accommodate. Anxiety symptoms are under control per patient report.   GAD 7 : Generalized Anxiety Score 03/19/2019 03/02/2019 01/10/2019 06/06/2017  Nervous, Anxious, on Edge 2 1 1 2   Control/stop worrying 0 0 1 2  Worry too much - different things 0 0 1 2   Trouble relaxing 2 0 1 3  Restless 0 0 1 2  Easily annoyed or irritable 3 1 1 3   Afraid - awful might happen 0 0 0 0  Total GAD 7 Score 7 2 6 14       Past Medical History:  Diagnosis Date  . Anemia   . Anxiety   . Bipolar affective disorder ([place])   . Chronic migraine w/o aura w/o status migrainosus, not intractable   . Depression     Past Surgical History:  Procedure Laterality Date  . CESAREAN SECTION  2016  . DILATION AND CURETTAGE OF UTERUS     EAB at 18wks  . TUBAL LIGATION Bilateral 05/01/2017   Procedure: POST PARTUM TUBAL LIGATION;  Surgeon: Donnamae Jude, MD;  Location: [place];  Service: Gynecology;  Laterality: Bilateral;    Family History  Problem Relation Age of Onset  . Depression Mother   . Migraines Father     Social History   Socioeconomic History  . Marital status: Single    Spouse name: Not on file  . Number of children: 6  . Years of education: Not on file  . Highest education level: 11th grade  Occupational History  . Occupation: unemployed  Social Needs  . Financial resource strain: Not on file  . Food insecurity    Worry: Not on file    Inability: Not on file  . Transportation needs    Medical: Not on file    Non-medical: Not on file  Tobacco Use  . Smoking status: Current Every Day Smoker    Types: Cigarettes  Last attempt to quit: 10/06/2016    Years since quitting: 2.4  . Smokeless tobacco: Never Used  . Tobacco comment: 5 cigs/day  Substance and Sexual Activity  . Alcohol use: No  . Drug use: No    Comment: positve THC during pregnancy  . Sexual activity: Yes    Birth control/protection: Surgical  Lifestyle  . Physical activity    Days per week: Not on file    Minutes per session: Not on file  . Stress: Not on file  Relationships  . Social Musicianconnections    Talks on phone: Not on file    Gets together: Not on file    Attends religious service: Not on file    Active member of club or organization: Not on file     Attends meetings of clubs or organizations: Not on file    Relationship status: Not on file  Other Topics Concern  . Not on file  Social History Narrative   Patient is right-handed. She lives with her boyfrien and children in a one level home. She drinks 1-2 cups of coffee, and three 8 oz bottles of Pepsi a day. She is active with her children.     Observations/Objective: Awake, alert and oriented x 3   Review of Systems  Constitutional: Negative for fever, malaise/fatigue and weight loss.  HENT: Negative.  Negative for nosebleeds.   Eyes: Negative.  Negative for blurred vision, double vision and photophobia.  Respiratory: Negative.  Negative for cough and shortness of breath.   Cardiovascular: Negative.  Negative for chest pain, palpitations and leg swelling.  Gastrointestinal: Negative.  Negative for heartburn, nausea and vomiting.  Musculoskeletal: Negative.  Negative for myalgias.  Neurological: Positive for headaches. Negative for dizziness, focal weakness and seizures.  Psychiatric/Behavioral: Negative for suicidal ideas. The patient is nervous/anxious.     Assessment and Plan: Alphonzo LemmingsWhitney was seen today for follow-up.  Diagnoses and all orders for this visit:  Anxiety -     hydrOXYzine (ATARAX/VISTARIL) 50 MG tablet; Take 1 tablet (50 mg total) by mouth 3 (three) times daily as needed for anxiety. Well controlled with 50mg  BID  Migraine aura, persistent, intractable Continue neurology follow up.     Follow Up Instructions Return if symptoms worsen or fail to improve.     I discussed the assessment and treatment plan with the patient. The patient was provided an opportunity to ask questions and all were answered. The patient agreed with the plan and demonstrated an understanding of the instructions.   The patient was advised to call back or seek an in-person evaluation if the symptoms worsen or if the condition fails to improve as anticipated.  I provided 18 minutes of  non-face-to-face time during this encounter including median intraservice time, reviewing previous notes, labs, imaging, medications and explaining diagnosis and management.  Claiborne RiggZelda W Fleming, FNP-BC

## 2019-03-20 ENCOUNTER — Encounter: Payer: Self-pay | Admitting: Nurse Practitioner

## 2019-03-20 ENCOUNTER — Ambulatory Visit
Admission: RE | Admit: 2019-03-20 | Discharge: 2019-03-20 | Disposition: A | Discharge status: Home / Self Care | Payer: Medicaid Other | Source: Ambulatory Visit | Attending: Neurology | Admitting: Neurology

## 2019-03-20 ENCOUNTER — Other Ambulatory Visit: Payer: Self-pay

## 2019-03-20 DIAGNOSIS — R519 Headache, unspecified: Secondary | ICD-10-CM

## 2019-03-20 MED ORDER — GADOBENATE DIMEGLUMINE 529 MG/ML IV SOLN
17.0000 mL | Freq: Once | INTRAVENOUS | Status: AC | PRN
  Administered 2019-03-20: 17 mL via INTRAVENOUS

## 2019-03-23 NOTE — Progress Notes (Signed)
See email Pt aware via e-mail mychart

## 2019-04-03 ENCOUNTER — Encounter: Payer: Self-pay | Admitting: Nurse Practitioner

## 2019-04-13 ENCOUNTER — Other Ambulatory Visit: Payer: Self-pay | Admitting: Nurse Practitioner

## 2019-04-13 ENCOUNTER — Ambulatory Visit: Payer: Medicaid Other | Admitting: Nurse Practitioner

## 2019-04-24 ENCOUNTER — Other Ambulatory Visit: Payer: Self-pay | Admitting: Nurse Practitioner

## 2019-04-24 DIAGNOSIS — F419 Anxiety disorder, unspecified: Secondary | ICD-10-CM

## 2019-05-13 ENCOUNTER — Other Ambulatory Visit: Payer: Self-pay | Admitting: Nurse Practitioner

## 2019-05-13 DIAGNOSIS — F419 Anxiety disorder, unspecified: Secondary | ICD-10-CM

## 2019-05-17 ENCOUNTER — Other Ambulatory Visit: Payer: Self-pay | Admitting: Family Medicine

## 2019-05-22 MED ORDER — NORTRIPTYLINE HCL 50 MG PO CAPS: 50.0000 mg | ORAL_CAPSULE | Freq: Every day | ORAL | 3 refills | Status: DC

## 2019-06-08 ENCOUNTER — Other Ambulatory Visit: Payer: Self-pay | Admitting: Nurse Practitioner

## 2019-06-08 DIAGNOSIS — F419 Anxiety disorder, unspecified: Secondary | ICD-10-CM

## 2019-06-18 NOTE — Progress Notes (Signed)
Virtual Visit via Video Note The purpose of this virtual visit is to provide medical care while limiting exposure to the novel coronavirus.    Consent was obtained for video visit:  Yes.   Answered questions that patient had about telehealth interaction:  Yes.   I discussed the limitations, risks, security and privacy concerns of performing an evaluation and management service by telemedicine. I also discussed with the patient that there may be a patient responsible charge related to this service. The patient expressed understanding and agreed to proceed.  Pt location: Home Physician Location: Home Name of referring provider:  Claiborne RiggFleming, Zelda W, NP I connected with Doris King at patients initiation/request on 06/20/2019 at  9:50 AM EST by video enabled telemedicine application and verified that I am speaking with the correct person using two identifiers. Pt MRN:  [id number] Pt DOB:  04/24/1986 Video Participants:  Doris King   History of Present Illness:  Doris King is a 33 year old woman who follows up for migraine.  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   She was started on nortriptyline, which then occurred once a week.  They are less intense (3-4/10).  They started to increase in frequency a month ago so nortriptyline was  increased to 50mg  2 weeks ago.  Only one migraine over past 2 weeks.  Rizatriptan aborts headache in 45 minutes, but it makes her drowsy.  Current NSAIDS:  none Current analgesics:  none Current triptans:  rizatriptan 10mg  Current ergotamine:  none Current anti-emetic:  Zofran 4mg  Current muscle relaxants:  none Current anti-anxiolytic:  none Current sleep aide:  none Current Antihypertensive medications:  none Current Antidepressant medications:  nortriptyline 50mg  at bedtime; Paxil 10mg  Current  Anticonvulsant medications:  none Current anti-CGRP:  none Current Vitamins/Herbal/Supplements:  MVI Current Antihistamines/Decongestants: none Other therapy:  none Hormone/birth control:  none  Caffeine:  1 cup of coffee daily, 3 cans Pepsi daily Smoker:  Recently quit Diet:  2 or 3 16oz bottles of water daily Exercise:  Not routine Depression:  sometimes; Anxiety:  yes Other pain:  Chronic back pain Sleep hygiene:  Poor sleep 3-4 hours No history of concussion  HISTORY: Onset: Infrequently in her mid 3320s.  Migraines have been worse over the past 3 months, requiring an ED visit in May and June.  She always has a constant dull headache with fluctuations.  No preceding event. Location:  Often start from back of head and radiates to front of head, usually right side Quality:  Shooting pain, throbbing Initial intensity:  6/10 (sometimes worse).  She denies thunderclap headache Premonitory Phase:  none Postdrome:  none Aura:  No Associated symptoms: Nausea, vomiting, photophobia, phonophobia, bilateral dilated pupils.  She denies associated visual disturbance or unilateral numbness or weakness. Initial duration:  5 hours Initial Frequency:  Every 3 days Initial Frequency of abortive medication: Takes Excedrin Migraine daily Triggers: None Relieving factors: Sometimes Excedrin migraine Activity:  Difficult to function when severe.    Past NSAIDS:  ASA, ibuprofen, naproxen Past analgesics:  Tylenol; Excedrin Migraine Past abortive triptans:  Sumatriptan 50mg  (made her feel sick) Past abortive ergotamine:  none Past muscle relaxants:  Flexeril Past anti-emetic:  Reglan, Zofran ODT 4mg , Compazine 10mg  Past antihypertensive medications:  none Past antidepressant medications:  Many years ago (for depression, cannot remember) Past anticonvulsant medications:  none Past anti-CGRP:  none Past vitamins/Herbal/Supplements:  none Past antihistamines/decongestants:  none Other past  therapies:  none   Family history of headache:  Father (migraines), sister (occipital neuralgia)  Past Medical History: Past Medical History:  Diagnosis Date  . Anemia   . Anxiety   . Bipolar affective disorder (HCC)   . Chronic migraine w/o aura w/o status migrainosus, not intractable   . Depression     Medications: Outpatient Encounter Medications as of 06/20/2019  Medication Sig  . aspirin-acetaminophen-caffeine (EXCEDRIN MIGRAINE) 250-250-65 MG tablet Take 1 tablet by mouth every 6 (six) hours as needed for headache or migraine.  . hydrOXYzine (ATARAX/VISTARIL) 50 MG tablet Take 1 tablet (50 mg total) by mouth 3 (three) times daily as needed.  . Multiple Vitamin (MULTIVITAMIN WITH MINERALS) TABS tablet Take 1 tablet by mouth daily.  . naproxen (NAPROSYN) 500 MG tablet TAKE 1 TABLET(500 MG) BY MOUTH TWICE DAILY  . nortriptyline (PAMELOR) 50 MG capsule Take 1 capsule (50 mg total) by mouth at bedtime.  . ondansetron (ZOFRAN ODT) 4 MG disintegrating tablet Take 1 tablet (4 mg total) by mouth every 8 (eight) hours as needed for nausea or vomiting.  . rizatriptan (MAXALT) 10 MG tablet Take 1 tablet earliest onset of migraine.  May repeat in 2 hours if needed.  Maximum 2 tablets in 24 hours   No facility-administered encounter medications on file as of 06/20/2019.     Allergies: Allergies  Allergen Reactions  . Imitrex [Sumatriptan] Nausea And Vomiting and Other (See Comments)    altered    Family History: Family History  Problem Relation Age of Onset  . Depression Mother   . Migraines Father     Social History: Social History   Socioeconomic History  . Marital status: Single    Spouse name: Not on file  . Number of children: 6  . Years of education: Not on file  . Highest education level: 11th grade  Occupational History  . Occupation: unemployed  Social Needs  . Financial resource strain: Not on file  . Food insecurity    Worry: Not on file    Inability: Not  on file  . Transportation needs    Medical: Not on file    Non-medical: Not on file  Tobacco Use  . Smoking status: Current Every Day Smoker    Types: Cigarettes    Last attempt to quit: 10/06/2016    Years since quitting: 2.6  . Smokeless tobacco: Never Used  . Tobacco comment: 5 cigs/day  Substance and Sexual Activity  . Alcohol use: No  . Drug use: No    Comment: positve THC during pregnancy  . Sexual activity: Yes    Birth control/protection: Surgical  Lifestyle  . Physical activity    Days per week: Not on file    Minutes per session: Not on file  . Stress: Not on file  Relationships  . Social Musician on phone: Not on file    Gets together: Not on file    Attends religious service: Not on file    Active member of club or organization: Not on file    Attends meetings of clubs or organizations: Not on file    Relationship status: Not on file  . Intimate partner violence    Fear of current or ex partner: Not on file    Emotionally abused: Not on file    Physically abused: Not on file    Forced sexual activity: Not on file  Other Topics Concern  .  Not on file  Social History Narrative   Patient is right-handed. She lives with her boyfrien and children in a one level home. She drinks 1-2 cups of coffee, and three 8 oz bottles of Pepsi a day. She is active with her children.    Observations/Objective:   Height 5\' 4"  (1.626 m), weight 185 lb (83.9 kg), unknown if currently breastfeeding. No acute distress.  Alert and oriented.  Speech fluent and not dysarthric.  Language intact.  Eyes orthophoric on primary gaze.  Face symmetric.  Assessment and Plan:   Migraine without aura, without status migrainosus, not intractable.  She is doing well.  1.  For preventative management, nortriptyline 50mg  at bedtime 2.  For abortive therapy, rizatriptan 10mg  3.  Limit use of pain relievers to no more than 2 days out of week to prevent risk of rebound or medication-overuse  headache. 4.  Keep headache diary 5.  Exercise, hydration, caffeine cessation, sleep hygiene, monitor for and avoid triggers 6.  Consider:  magnesium citrate 400mg  daily, riboflavin 400mg  daily, and coenzyme Q10 100mg  three times daily 7. Follow up 5 months.   Follow Up Instructions:    -I discussed the assessment and treatment plan with the patient. The patient was provided an opportunity to ask questions and all were answered. The patient agreed with the plan and demonstrated an understanding of the instructions.   The patient was advised to call back or seek an in-person evaluation if the symptoms worsen or if the condition fails to improve as anticipated.     Dudley Major, DO

## 2019-06-20 ENCOUNTER — Telehealth (INDEPENDENT_AMBULATORY_CARE_PROVIDER_SITE_OTHER): Payer: Medicaid Other | Admitting: Neurology

## 2019-06-20 ENCOUNTER — Other Ambulatory Visit: Payer: Self-pay

## 2019-06-20 ENCOUNTER — Encounter: Payer: Self-pay | Admitting: Neurology

## 2019-06-20 VITALS — Ht 64.0 in | Wt 185.0 lb

## 2019-06-20 DIAGNOSIS — G43009 Migraine without aura, not intractable, without status migrainosus: Secondary | ICD-10-CM | POA: Diagnosis not present

## 2019-06-21 ENCOUNTER — Other Ambulatory Visit: Payer: Self-pay | Admitting: Nurse Practitioner

## 2019-07-18 ENCOUNTER — Other Ambulatory Visit: Payer: Self-pay | Admitting: Neurology

## 2019-07-30 ENCOUNTER — Other Ambulatory Visit: Payer: Self-pay | Admitting: Family Medicine

## 2019-08-12 ENCOUNTER — Other Ambulatory Visit: Payer: Self-pay | Admitting: Family Medicine

## 2019-08-12 DIAGNOSIS — F419 Anxiety disorder, unspecified: Secondary | ICD-10-CM

## 2019-09-07 ENCOUNTER — Other Ambulatory Visit: Payer: Self-pay | Admitting: Nurse Practitioner

## 2019-09-07 ENCOUNTER — Other Ambulatory Visit: Payer: Self-pay | Admitting: Family Medicine

## 2019-09-07 ENCOUNTER — Encounter: Payer: Self-pay | Admitting: Nurse Practitioner

## 2019-09-07 DIAGNOSIS — F419 Anxiety disorder, unspecified: Secondary | ICD-10-CM

## 2019-09-07 MED ORDER — NAPROXEN 500 MG PO TABS: 500.0000 mg | ORAL_TABLET | Freq: Two times a day (BID) | ORAL | 0 refills | Status: DC

## 2019-09-09 ENCOUNTER — Other Ambulatory Visit: Payer: Self-pay | Admitting: Nurse Practitioner

## 2019-09-09 DIAGNOSIS — F419 Anxiety disorder, unspecified: Secondary | ICD-10-CM

## 2019-09-09 MED ORDER — HYDROXYZINE HCL 50 MG PO TABS: 50.0000 mg | ORAL_TABLET | Freq: Three times a day (TID) | ORAL | 0 refills | Status: DC | PRN

## 2019-10-04 ENCOUNTER — Other Ambulatory Visit: Payer: Self-pay | Admitting: Family Medicine

## 2019-10-11 ENCOUNTER — Other Ambulatory Visit: Payer: Self-pay | Admitting: Neurology

## 2019-10-11 ENCOUNTER — Other Ambulatory Visit: Payer: Self-pay | Admitting: Nurse Practitioner

## 2019-10-11 DIAGNOSIS — F419 Anxiety disorder, unspecified: Secondary | ICD-10-CM

## 2019-10-22 ENCOUNTER — Ambulatory Visit (INDEPENDENT_AMBULATORY_CARE_PROVIDER_SITE_OTHER)
Admission: RE | Admit: 2019-10-22 | Discharge: 2019-10-22 | Disposition: A | Discharge status: Home / Self Care | Payer: Medicaid Other | Source: Ambulatory Visit

## 2019-10-22 DIAGNOSIS — M545 Low back pain, unspecified: Secondary | ICD-10-CM

## 2019-10-22 MED ORDER — CYCLOBENZAPRINE HCL 10 MG PO TABS: 10.0000 mg | ORAL_TABLET | Freq: Two times a day (BID) | ORAL | 0 refills | Status: DC | PRN

## 2019-10-22 MED ORDER — IBUPROFEN 800 MG PO TABS: 800.0000 mg | ORAL_TABLET | Freq: Three times a day (TID) | ORAL | 0 refills | Status: DC | PRN

## 2019-10-22 NOTE — Discharge Instructions (Addendum)
Take the prescribed ibuprofen as needed for your pain.  Take the muscle relaxer Flexeril as needed for muscle spasm; do not drive, operate machinery, or drink alcohol with this medication as it may make you drowsy.    Follow up with your primary care provider or an orthopedist if your pain is not improving.  Go to the emergency department if you have worsening pain; or develop new symptoms such as difficulty with urination, weakness, numbness, loss of control of your bladder or bowels, fever, chills or other concerns.

## 2019-10-22 NOTE — ED Provider Notes (Signed)
Virtual Visit via Video Note:  Doris King  initiated request for Telemedicine visit with [facility] Urgent Care team. I connected with Doris King  on 10/22/2019 at 5:36 PM  for a synchronized telemedicine visit using a video enabled HIPPA compliant telemedicine application. I verified that I am speaking with Doris King  using two identifiers. Doris Bail, NP  was physically located in a [facility] Urgent care site and Doris King was located at a different location.   The limitations of evaluation and management by telemedicine as well as the availability of in-person appointments were discussed. Patient was informed that she  may incur a bill ( including co-pay) for this virtual visit encounter. Doris King  expressed understanding and gave verbal consent to proceed with virtual visit.     History of Present Illness:Doris King  is a 34 y.o. female presents for evaluation of muscle spasms in bilateral lower back.  She denies falls or trauma but states she thinks she "twisted wrong".   She denies numbness, paresthesias, weakness, loss of bowel/bladder control, rash, lesions, ecchymosis, erythema, abdominal pain, dysuria, or other symptoms.  She has been treating her discomfort at home with ibuprofen without relief.  She denies pregnancy or breastfeeding.     Allergies  Allergen Reactions  . Imitrex [Sumatriptan] Nausea And Vomiting and Other (See Comments)    altered     Past Medical History:  Diagnosis Date  . Anemia   . Anxiety   . Bipolar affective disorder (HCC)   . Chronic migraine w/o aura w/o status migrainosus, not intractable   . Depression      Social History   Tobacco Use  . Smoking status: Current Every Day Smoker    Types: Cigarettes    Last attempt to quit: 10/06/2016    Years since quitting: 3.0  . Smokeless tobacco: Never Used  . Tobacco comment: 5 cigs/day  Substance Use Topics  . Alcohol use: No  . Drug use: No    Comment: positve THC  during pregnancy    ROS: as stated in HPI.  All other systems reviewed and negative.      Observations/Objective: Physical Exam  VITALS: Patient denies fever. GENERAL: Alert, appears well and in no acute distress. HEENT: Atraumatic. NECK: Normal movements of the head and neck. CARDIOPULMONARY: No increased WOB. Speaking in clear sentences. I:E ratio WNL.  MS: Moves all visible extremities without noticeable abnormality. PSYCH: Pleasant and cooperative, well-groomed. Speech normal rate and rhythm. Affect is appropriate. Insight and judgement are appropriate. Attention is focused, linear, and appropriate.  NEURO: CN grossly intact. Oriented as arrived to appointment on time with no prompting. Moves both UE equally.  SKIN: No obvious lesions, wounds, erythema, or cyanosis noted on face or hands.   Assessment and Plan:    ICD-10-CM   1. Acute bilateral low back pain without sciatica  M54.5        Follow Up Instructions: Treating with ibuprofen and Flexeril.  Precautions for drowsiness with Flexeril discussed with patient.  Instructed her to follow-up with her PCP or orthopedics or come here to be seen in person if her symptoms or not improving.  Patient agrees to plan of care.      I discussed the assessment and treatment plan with the patient. The patient was provided an opportunity to ask questions and all were answered. The patient agreed with the plan and demonstrated an understanding of the instructions.   The patient was advised to call back or  seek an in-person evaluation if the symptoms worsen or if the condition fails to improve as anticipated.      Sharion Balloon, NP  10/22/2019 5:36 PM         Sharion Balloon, NP 10/22/19 1736

## 2019-11-01 ENCOUNTER — Other Ambulatory Visit: Payer: Self-pay | Admitting: Nurse Practitioner

## 2019-11-01 ENCOUNTER — Other Ambulatory Visit: Payer: Self-pay | Admitting: Family Medicine

## 2019-11-01 ENCOUNTER — Other Ambulatory Visit: Payer: Self-pay | Admitting: Neurology

## 2019-11-01 DIAGNOSIS — F419 Anxiety disorder, unspecified: Secondary | ICD-10-CM

## 2019-11-05 ENCOUNTER — Encounter: Payer: Self-pay | Admitting: Nurse Practitioner

## 2019-11-16 NOTE — Progress Notes (Deleted)
NEUROLOGY FOLLOW UP OFFICE NOTE  [name] [id number]  HISTORY OF PRESENT ILLNESS: Doris King is a 34 year old woman who follows up for migraine.  UPDATE: Intensity:  *** Duration:  *** Frequency:  ***  Current NSAIDS:none Current analgesics:none Current triptans:rizatriptan 10mg  Current ergotamine:none Current anti-emetic:Zofran 4mg  Current muscle relaxants:none Current anti-anxiolytic:none Current sleep aide:none Current Antihypertensive medications:none Current Antidepressant medications:nortriptyline 50mg  at bedtime; Paxil 10mg  Current Anticonvulsant medications:none Current anti-CGRP:none Current Vitamins/Herbal/Supplements:MVI Current Antihistamines/Decongestants:none Other therapy:none Hormone/birth control:none  Caffeine:1 cup of coffee daily, 3 cans Pepsi daily Smoker:Recently quit Diet:2 or 3 16oz bottles of water daily Exercise:Not routine Depression:sometimes; Anxiety:yes Other pain:Chronic back pain Sleep hygiene:Poor sleep 3-4 hours No history of concussion  HISTORY: Onset: Infrequently in her mid 70s. Migraines have been worse over the past 3 months, requiring an ED visit in May and June.She always has a constant dull headache with fluctuations. No preceding event. Location:Often start from back of head and radiates to front of head, usually right side Quality:Shooting pain, throbbing Initial intensity:6/10 (sometimes worse).Shedenies thunderclap headache Premonitory Phase:none Postdrome:none Aura: No Associated symptoms: Nausea, vomiting, photophobia, phonophobia, bilateral dilated pupils.Shedenies associated visual disturbance orunilateral numbness or weakness. Initial duration:5 hours Initial Frequency:Every 3 days Initial Frequency of abortive medication:Takes Excedrin Migraine daily Triggers: None Relieving factors: Sometimes Excedrin  migraine Activity:Difficult to function when severe.  MRI of brain with and without contrast from 03/20/2019 personally reviewed showed incidental left maxillar and right sphenoid sinus mucous retention cysts but otherwise unremarkable.   Past NSAIDS:ASA, ibuprofen, naproxen Past analgesics:Tylenol; Excedrin Migraine Past abortive triptans:Sumatriptan 50mg  (made her feel sick) Past abortive ergotamine:none Past muscle relaxants:Flexeril Past anti-emetic:Reglan, Zofran ODT 4mg , Compazine 10mg  Past antihypertensive medications:none Past antidepressant medications:Many years ago (for depression, cannot remember) Past anticonvulsant medications:none Past anti-CGRP:none Past vitamins/Herbal/Supplements:none Past antihistamines/decongestants:none Other past therapies:none   Family history of headache:Father (migraines), sister (occipital neuralgia)  PAST MEDICAL HISTORY: Past Medical History:  Diagnosis Date  . Anemia   . Anxiety   . Bipolar affective disorder (HCC)   . Chronic migraine w/o aura w/o status migrainosus, not intractable   . Depression     MEDICATIONS: Current Outpatient Medications on File Prior to Visit  Medication Sig Dispense Refill  . aspirin-acetaminophen-caffeine (EXCEDRIN MIGRAINE) 250-250-65 MG tablet Take 1 tablet by mouth every 6 (six) hours as needed for headache or migraine. 30 tablet 0  . cyclobenzaprine (FLEXERIL) 10 MG tablet Take 1 tablet (10 mg total) by mouth 2 (two) times daily as needed for muscle spasms. 20 tablet 0  . hydrOXYzine (ATARAX/VISTARIL) 50 MG tablet Take 1 tablet (50 mg total) by mouth 3 (three) times daily as needed. 90 tablet 0  . ibuprofen (ADVIL) 800 MG tablet Take 1 tablet (800 mg total) by mouth every 8 (eight) hours as needed. 21 tablet 0  . Multiple Vitamin (MULTIVITAMIN WITH MINERALS) TABS tablet Take 1 tablet by mouth daily.    . naproxen (NAPROSYN) 500 MG tablet Take 1 tablet (500 mg total)  by mouth 2 (two) times daily with a meal. Must have office visit for refills 60 tablet 0  . nortriptyline (PAMELOR) 50 MG capsule TAKE 1 CAPSULE(50 MG) BY MOUTH AT BEDTIME 30 capsule 3  . ondansetron (ZOFRAN-ODT) 4 MG disintegrating tablet DISSOLVE 1 TABLET(4 MG) ON THE TONGUE EVERY 8 HOURS AS NEEDED FOR NAUSEA OR VOMITING 20 tablet 3  . PARoxetine (PAXIL) 10 MG tablet TK 1 T PO D FOR ANXIETY    . rizatriptan (MAXALT) 10 MG tablet Take 1 tablet  earliest onset of migraine.  May repeat in 2 hours if needed.  Maximum 2 tablets in 24 hours 10 tablet 3  . traZODone (DESYREL) 50 MG tablet TK 1/2 TO 1 T PO HS PRF SLP     No current facility-administered medications on file prior to visit.    ALLERGIES: Allergies  Allergen Reactions  . Imitrex [Sumatriptan] Nausea And Vomiting and Other (See Comments)    altered    FAMILY HISTORY: Family History  Problem Relation Age of Onset  . Depression Mother   . Migraines Father   . Healthy Child     SOCIAL HISTORY: Social History   Socioeconomic History  . Marital status: Single    Spouse name: Not on file  . Number of children: 6  . Years of education: Not on file  . Highest education level: 11th grade  Occupational History  . Occupation: unemployed  Tobacco Use  . Smoking status: Current Every Day Smoker    Types: Cigarettes    Last attempt to quit: 10/06/2016    Years since quitting: 3.1  . Smokeless tobacco: Never Used  . Tobacco comment: 5 cigs/day  Substance and Sexual Activity  . Alcohol use: No  . Drug use: No    Comment: positve THC during pregnancy  . Sexual activity: Yes    Birth control/protection: Surgical  Other Topics Concern  . Not on file  Social History Narrative   Patient is right-handed. She lives with her boyfrien and children in a one level home. She drinks 1-2 cups of coffee, and three 8 oz bottles of Pepsi a day. She is active with her children.   Social Determinants of Health   Financial Resource Strain:    . Difficulty of Paying Living Expenses:   Food Insecurity:   . Worried About Programme researcher, broadcasting/film/video in the Last Year:   . Barista in the Last Year:   Transportation Needs:   . Freight forwarder (Medical):   Marland Kitchen Lack of Transportation (Non-Medical):   Physical Activity:   . Days of Exercise per Week:   . Minutes of Exercise per Session:   Stress:   . Feeling of Stress :   Social Connections:   . Frequency of Communication with Friends and Family:   . Frequency of Social Gatherings with Friends and Family:   . Attends Religious Services:   . Active Member of Clubs or Organizations:   . Attends Banker Meetings:   Marland Kitchen Marital Status:   Intimate Partner Violence:   . Fear of Current or Ex-Partner:   . Emotionally Abused:   Marland Kitchen Physically Abused:   . Sexually Abused:     PHYSICAL EXAM: *** General: No acute distress.  Patient appears ***-groomed.   Head:  Normocephalic/atraumatic Eyes:  Fundi examined but not visualized Neck: supple, no paraspinal tenderness, full range of motion Heart:  Regular rate and rhythm Lungs:  Clear to auscultation bilaterally Back: No paraspinal tenderness Neurological Exam: alert and oriented to person, place, and time. Attention span and concentration intact, recent and remote memory intact, fund of knowledge intact.  Speech fluent and not dysarthric, language intact.  CN II-XII intact. Bulk and tone normal, muscle strength 5/5 throughout.  Sensation to light touch, temperature and vibration intact.  Deep tendon reflexes 2+ throughout, toes downgoing.  Finger to nose and heel to shin testing intact.  Gait normal, Romberg negative.  IMPRESSION: Migraine without aura, without status migrainosus, not intractable  PLAN: 1.  For preventative management, nortriptyline 50mg  at bedtime 2.  For abortive therapy, rizatriptan 10mg  3.  Limit use of pain relievers to no more than 2 days out of week to prevent risk of rebound or  medication-overuse headache. 4.  Keep headache diary 5.  Exercise, hydration, caffeine cessation, sleep hygiene, monitor for and avoid triggers 6. Follow up ***   Metta Clines, DO  CC: Geryl Rankins, NP

## 2019-11-19 ENCOUNTER — Ambulatory Visit: Payer: Medicaid Other | Admitting: Neurology

## 2020-01-03 ENCOUNTER — Other Ambulatory Visit: Payer: Self-pay | Admitting: Neurology

## 2020-01-03 ENCOUNTER — Other Ambulatory Visit: Payer: Self-pay | Admitting: Family Medicine

## 2020-01-03 ENCOUNTER — Other Ambulatory Visit: Payer: Self-pay | Admitting: Nurse Practitioner

## 2020-01-03 ENCOUNTER — Other Ambulatory Visit: Payer: Self-pay | Admitting: Critical Care Medicine

## 2020-01-03 DIAGNOSIS — F419 Anxiety disorder, unspecified: Secondary | ICD-10-CM

## 2020-01-03 NOTE — Telephone Encounter (Signed)
Please refill if necessary.

## 2020-01-03 NOTE — Telephone Encounter (Signed)
Please refill if necessary.  

## 2020-01-05 MED ORDER — HYDROXYZINE HCL 50 MG PO TABS: 50.0000 mg | ORAL_TABLET | Freq: Three times a day (TID) | ORAL | 0 refills | Status: DC | PRN

## 2020-01-05 MED ORDER — NAPROXEN 500 MG PO TABS: 500.0000 mg | ORAL_TABLET | Freq: Two times a day (BID) | ORAL | 0 refills | Status: DC

## 2020-01-07 NOTE — Telephone Encounter (Signed)
RX sent to the pharmacy  

## 2020-04-28 ENCOUNTER — Other Ambulatory Visit: Payer: Self-pay | Admitting: Neurology

## 2020-04-28 ENCOUNTER — Other Ambulatory Visit: Payer: Self-pay | Admitting: Nurse Practitioner

## 2020-05-20 ENCOUNTER — Encounter: Payer: Self-pay | Admitting: Nurse Practitioner

## 2020-05-20 ENCOUNTER — Other Ambulatory Visit: Payer: Self-pay | Admitting: Neurology

## 2020-05-20 ENCOUNTER — Ambulatory Visit: Payer: Medicaid Other | Attending: Family | Admitting: Family

## 2020-05-20 ENCOUNTER — Other Ambulatory Visit: Payer: Self-pay

## 2020-05-20 DIAGNOSIS — F419 Anxiety disorder, unspecified: Secondary | ICD-10-CM

## 2020-05-20 MED ORDER — HYDROXYZINE HCL 50 MG PO TABS: 50.0000 mg | ORAL_TABLET | Freq: Three times a day (TID) | ORAL | 0 refills | Status: DC | PRN

## 2020-05-20 NOTE — Progress Notes (Signed)
Virtual Visit via Telephone Note  I connected with Doris King, on 05/20/2020 at 2:10 PM by telephone due to the COVID-19 pandemic and verified that I am speaking with the correct person using two identifiers.  Due to current restrictions/limitations of in-office visits due to the COVID-19 pandemic, this scheduled clinical appointment was converted to a telehealth visit.   Consent: I discussed the limitations, risks, security and privacy concerns of performing an evaluation and management service by telephone and the availability of in person appointments. I also discussed with the patient that there may be a patient responsible charge related to this service. The patient expressed understanding and agreed to proceed.   Location of Patient: Home  [facility]   Persons participating in Telemedicine visit: Jolana Coykendall Ricky Stabs, NP Laruth Bouchard, CMA  History of Present Illness:  CC: Medication Refill  Subjective: [name] is a 34 y.o. female with history of GERD, cervical dysplasia, depression, anxiety, and bipolar who presents for medication refills.   1. ANXIETY FOLLOW-UP: 03/19/2019: Visit with nurse practitioner Meredeth Ide. Controlled and continued on Hydroxyzine.  05/20/2020: Anxious mood: yes  Excessive worrying: yes Irritability: yes  Sweating: no Nausea: yes Palpitations:no Hyperventilation: no Panic attacks: yes Agoraphobia: no  Obscessions/compulsions: no Depressed mood: no Depression screen [facility] 2/9 03/19/2019 03/02/2019 01/10/2019 06/06/2017 04/05/2017  Decreased Interest 0 0 1 2 0  Down, Depressed, Hopeless 1 1 1 3  0  PHQ - 2 Score 1 1 2 5  0  Altered sleeping 1 1 1 3 2   Tired, decreased energy 1 1 1 3 2   Change in appetite 0 0 0 3 0  Feeling bad or failure about yourself  0 1 1 1  0  Trouble concentrating 1 0 0 1 0  Moving slowly or fidgety/restless 0 0 0 3 0  Suicidal thoughts 0 0 0 0 0  PHQ-9 Score  4 4 5 19 4    Anhedonia: no Weight changes: no Insomnia: yes hard to stay asleep stay asleep Hypersomnia: no Fatigue/loss of energy: no Feelings of worthlessness: no Feelings of guilt: no Impaired concentration/indecisiveness: no Suicidal ideations: no  Crying spells: sometimes Recent Stressors/Life Changes: yes, job  Past Medical History:  Diagnosis Date  . Anemia   . Anxiety   . Bipolar affective disorder (HCC)   . Chronic migraine w/o aura w/o status migrainosus, not intractable   . Depression    Allergies  Allergen Reactions  . Imitrex [Sumatriptan] Nausea And Vomiting and Other (See Comments)    altered    Current Outpatient Medications on File Prior to Visit  Medication Sig Dispense Refill  . aspirin-acetaminophen-caffeine (EXCEDRIN MIGRAINE) 250-250-65 MG tablet Take 1 tablet by mouth every 6 (six) hours as needed for headache or migraine. 30 tablet 0  . cyclobenzaprine (FLEXERIL) 10 MG tablet Take 1 tablet (10 mg total) by mouth 2 (two) times daily as needed for muscle spasms. 20 tablet 0  . hydrOXYzine (ATARAX/VISTARIL) 50 MG tablet Take 1 tablet (50 mg total) by mouth 3 (three) times daily as needed. 90 tablet 0  . ibuprofen (ADVIL) 800 MG tablet Take 1 tablet (800 mg total) by mouth every 8 (eight) hours as needed. 21 tablet 0  . Multiple Vitamin (MULTIVITAMIN WITH MINERALS) TABS tablet Take 1 tablet by mouth daily.    . naproxen (NAPROSYN) 500 MG tablet Take 1 tablet (500 mg total) by mouth 2 (two) times daily with a meal. Must have office visit for refills 60 tablet 0  .  nortriptyline (PAMELOR) 50 MG capsule TAKE 1 CAPSULE(50 MG) BY MOUTH AT BEDTIME 30 capsule 3  . ondansetron (ZOFRAN-ODT) 4 MG disintegrating tablet DISSOLVE 1 TABLET(4 MG) ON THE TONGUE EVERY 8 HOURS AS NEEDED FOR NAUSEA OR VOMITING 20 tablet 3  . PARoxetine (PAXIL) 10 MG tablet TK 1 T PO D FOR ANXIETY    . rizatriptan (MAXALT) 10 MG tablet Take 1 tablet earliest onset of migraine.  May repeat in 2  hours if needed.  Maximum 2 tablets in 24 hours 10 tablet 3  . traZODone (DESYREL) 50 MG tablet TK 1/2 TO 1 T PO HS PRF SLP     No current facility-administered medications on file prior to visit.    Observations/Objective: Alert and oriented x 3. Not in acute distress. Physical examination not completed as this is a telemedicine visit.  Assessment and Plan: 1. Anxiety: - Denies thoughts of self-harm and suicidal ideation. - Reports more anxiety lately related to her current job. Taking Hydroxyzine about two times daily as needed, seldom taking three times daily as needed.  - Continue Hydroxyzine as prescribed.  - If needing to take Hydroxyzine three times daily as needed on a consistent basis then may consider increasing the frequency of the medication.  - Follow-up in 4 to 6 weeks if anxiety continues to worsen on three times daily dose as needed. Otherwise follow-up with primary provider in 3 months or sooner if needed. - hydrOXYzine (ATARAX/VISTARIL) 50 MG tablet; Take 1 tablet (50 mg total) by mouth 3 (three) times daily as needed.  Dispense: 90 tablet; Refill: 0  Follow Up Instructions: Follow-up with primary provider in 3 months or sooner if needed.   Patient was given clear instructions to go to Emergency Department or return to medical center if symptoms don't improve, worsen, or new problems develop.The patient verbalized understanding.  I discussed the assessment and treatment plan with the patient. The patient was provided an opportunity to ask questions and all were answered. The patient agreed with the plan and demonstrated an understanding of the instructions.   The patient was advised to call back or seek an in-person evaluation if the symptoms worsen or if the condition fails to improve as anticipated.   I provided 10 minutes total of non-face-to-face time during this encounter including median intraservice time, reviewing previous notes, labs, imaging, medications,  management and patient verbalized understanding.    Rema Fendt, NP  [facility] [place], [place] [phone]   05/20/2020, 2:21 PM

## 2020-05-20 NOTE — Patient Instructions (Signed)

## 2020-05-22 ENCOUNTER — Other Ambulatory Visit: Payer: Self-pay | Admitting: Nurse Practitioner

## 2020-05-22 MED ORDER — ONDANSETRON 4 MG PO TBDP: ORAL_TABLET | ORAL | 3 refills | Status: DC

## 2020-05-26 ENCOUNTER — Other Ambulatory Visit: Payer: Self-pay

## 2020-05-26 ENCOUNTER — Other Ambulatory Visit: Payer: Self-pay | Admitting: Neurology

## 2020-05-26 MED ORDER — NAPROXEN 500 MG PO TABS
500.0000 mg | ORAL_TABLET | Freq: Two times a day (BID) | ORAL | 0 refills | Status: DC
Start: 2020-05-26 — End: 2021-01-18

## 2020-05-26 MED ORDER — RIZATRIPTAN BENZOATE 10 MG PO TABS: ORAL_TABLET | ORAL | 0 refills | Status: DC

## 2020-05-26 MED ORDER — NORTRIPTYLINE HCL 50 MG PO CAPS
50.0000 mg | ORAL_CAPSULE | Freq: Every day | ORAL | 0 refills | Status: DC
Start: 2020-05-26 — End: 2020-06-23

## 2020-05-26 NOTE — Telephone Encounter (Signed)
We can give a 30 day supply but she would need further refills from her PCP as we don't prescribe refills if haven't been seen in over a year.

## 2020-05-26 NOTE — Telephone Encounter (Signed)
Patient called and scheduled and appointment for 11/12/2020 with Dr. Everlena Cooper. She is now requesting refills on her nortriptyline 50 MG and rizatriptan 10 MG to last until then, if possible.  [facility] in [place] [address]

## 2020-05-27 NOTE — Telephone Encounter (Signed)
Rx for 30 days has been sent and a Mychart message sent to patient to inform. Also informed patient that any further refills have to go through her PCP.

## 2020-05-30 ENCOUNTER — Other Ambulatory Visit: Payer: Self-pay | Admitting: Neurology

## 2020-06-23 ENCOUNTER — Encounter: Payer: Self-pay | Admitting: Nurse Practitioner

## 2020-06-23 ENCOUNTER — Other Ambulatory Visit: Payer: Self-pay | Admitting: Nurse Practitioner

## 2020-06-23 MED ORDER — NORTRIPTYLINE HCL 50 MG PO CAPS: 50.0000 mg | ORAL_CAPSULE | Freq: Every day | ORAL | 0 refills | Status: DC

## 2020-06-23 MED ORDER — RIZATRIPTAN BENZOATE 10 MG PO TABS: ORAL_TABLET | ORAL | 0 refills | Status: DC

## 2020-06-24 ENCOUNTER — Other Ambulatory Visit: Payer: Self-pay | Admitting: Nurse Practitioner

## 2020-06-24 DIAGNOSIS — F419 Anxiety disorder, unspecified: Secondary | ICD-10-CM

## 2020-06-24 MED ORDER — HYDROXYZINE HCL 50 MG PO TABS: 50.0000 mg | ORAL_TABLET | Freq: Three times a day (TID) | ORAL | 0 refills | Status: DC | PRN

## 2020-07-21 ENCOUNTER — Encounter: Payer: Self-pay | Admitting: Nurse Practitioner

## 2020-07-23 ENCOUNTER — Other Ambulatory Visit: Payer: Self-pay | Admitting: Nurse Practitioner

## 2020-07-23 MED ORDER — TRAZODONE HCL 50 MG PO TABS
50.0000 mg | ORAL_TABLET | Freq: Every day | ORAL | 1 refills | Status: DC
Start: 2020-07-23 — End: 2020-09-23

## 2020-08-20 ENCOUNTER — Other Ambulatory Visit: Payer: Self-pay | Admitting: Nurse Practitioner

## 2020-08-20 DIAGNOSIS — F419 Anxiety disorder, unspecified: Secondary | ICD-10-CM

## 2020-08-20 NOTE — Telephone Encounter (Signed)
Requested medication (s) are due for refill today:yes  Requested medication (s) are on the active medication list:yes  Last refill: 06/24/20  #90 0 refills   Future visit scheduled: No  Notes to clinic:  last refill has note needs OV please review I believe this is old    Requested Prescriptions  Pending Prescriptions Disp Refills   hydrOXYzine (ATARAX/VISTARIL) 50 MG tablet [Pharmacy Med Name: HYDROXYZINE HCL 50MG  TABS (WHITE)] 90 tablet 0    Sig: TAKE 1 TABLET(50 MG) BY MOUTH THREE TIMES DAILY AS NEEDED      Ear, Nose, and Throat:  Antihistamines Passed - 08/20/2020 11:58 AM      Passed - Valid encounter within last 12 months    Recent Outpatient Visits           3 months ago Anxiety   [place] [facility] [place], [place], NP   1 year ago Anxiety   [place] [facility] [place], [place], NP   1 year ago Bipolar affective disorder, remission status unspecified (HCC)   [place] [facility] [place], [place], NP   1 year ago Migraine aura, persistent, intractable    [facility] [place] Stakes, MD       Future Appointments             In 2 months Storm Frisk, DO [facility]

## 2020-09-08 ENCOUNTER — Other Ambulatory Visit: Payer: Self-pay

## 2020-09-23 ENCOUNTER — Other Ambulatory Visit: Payer: Self-pay | Admitting: Nurse Practitioner

## 2020-09-23 ENCOUNTER — Encounter: Payer: Self-pay | Admitting: Nurse Practitioner

## 2020-09-23 MED ORDER — DOXEPIN HCL 150 MG PO CAPS: 150.0000 mg | ORAL_CAPSULE | Freq: Every day | ORAL | 3 refills | Status: DC

## 2020-09-24 ENCOUNTER — Other Ambulatory Visit: Payer: Self-pay | Admitting: Nurse Practitioner

## 2020-09-24 NOTE — Telephone Encounter (Signed)
Requested medication (s) are due for refill today: yes  Requested medication (s) are on the active medication list: yes  Last refill: 08/31/20  Future visit scheduled: no  Notes to clinic:  not delegated   Requested Prescriptions  Pending Prescriptions Disp Refills   ondansetron (ZOFRAN-ODT) 4 MG disintegrating tablet [Pharmacy Med Name: ONDANSETRON ODT 4MG  TABLETS] 20 tablet 3    Sig: DISSOLVE 1 TABLET(4 MG) ON THE TONGUE EVERY 8 HOURS AS NEEDED FOR NAUSEA OR VOMITING      Not Delegated - Gastroenterology: Antiemetics Failed - 09/23/2020  7:01 PM      Failed - This refill cannot be delegated      Passed - Valid encounter within last 6 months    Recent Outpatient Visits           4 months ago Anxiety   [place] [facility] [place], [place], NP   1 year ago Anxiety   [place] [facility] [place], [place], NP   1 year ago Bipolar affective disorder, remission status unspecified [facility])   [place] [facility] [place], [place], NP   1 year ago Migraine aura, persistent, intractable   [place] [facility] [place]a Stakes, MD       Future Appointments             In 1 month Storm Frisk, DO [facility] Neurology [facility]

## 2020-09-28 ENCOUNTER — Other Ambulatory Visit: Payer: Self-pay | Admitting: Nurse Practitioner

## 2020-09-28 MED ORDER — TRAZODONE HCL 100 MG PO TABS: 100.0000 mg | ORAL_TABLET | Freq: Every day | ORAL | 2 refills | Status: DC

## 2020-09-29 ENCOUNTER — Other Ambulatory Visit: Payer: Self-pay | Admitting: Nurse Practitioner

## 2020-09-29 MED ORDER — TRAZODONE HCL 100 MG PO TABS: 100.0000 mg | ORAL_TABLET | Freq: Every day | ORAL | 2 refills | Status: DC

## 2020-10-23 ENCOUNTER — Telehealth: Payer: Medicaid Other | Admitting: Emergency Medicine

## 2020-10-23 DIAGNOSIS — M545 Low back pain, unspecified: Secondary | ICD-10-CM

## 2020-10-23 MED ORDER — CYCLOBENZAPRINE HCL 10 MG PO TABS: 10.0000 mg | ORAL_TABLET | Freq: Three times a day (TID) | ORAL | 0 refills | Status: DC | PRN

## 2020-10-23 NOTE — Progress Notes (Signed)

## 2020-11-10 NOTE — Progress Notes (Signed)
Virtual Visit via Video Note The purpose of this virtual visit is to provide medical care while limiting exposure to the novel coronavirus.    Consent was obtained for video visit:  Yes.   Answered questions that patient had about telehealth interaction:  Yes.   I discussed the limitations, risks, security and privacy concerns of performing an evaluation and management service by telemedicine. I also discussed with the patient that there may be a patient responsible charge related to this service. The patient expressed understanding and agreed to proceed.  Pt location: Home Physician Location: office Name of referring provider:  Claiborne Rigg, NP I connected with Doris King at patients initiation/request on 11/12/2020 at 10:30 AM EDT by video enabled telemedicine application and verified that I am speaking with the correct person using two identifiers. Pt MRN:  [id number] Pt DOB:  [birth year] Video Participants:  Doris King  Assessment and Plan:   Migraine without aura, without status migrainosus, not intractable.  1.  Migraine prevention:  Nortriptyline 50mg  at bedtime 2.  Migraine rescue:  Ibuprofen (Rizatriptan 10mg  if severe). Zofran for nausea. 3.  .Limit use of pain relievers to no more than 2 days out of week to prevent risk of rebound or medication-overuse headache. 4.  Keep headache diary 5.  Follow up one year  History of Present Illness:  Doris King is a 35 year old woman who follows up for migraine.  UPDATE: Ran out nortriptyline 2 weeks ago. Intensity:  3-4/10 Duration:  45 minutes with ibuprofen or rizatriptan Frequency:  5-6 a month Current NSAIDS:none Current analgesics:none Current triptans:rizatriptan 10mg  Current ergotamine:none Current anti-emetic:Zofran 4mg  Current muscle relaxants:none Current anti-anxiolytic:none Current sleep aide:none Current Antihypertensive medications:none Current Antidepressant  medications:nortriptyline 50mg  at bedtime Current Anticonvulsant medications:none Current anti-CGRP:none Current Vitamins/Herbal/Supplements:MVI Current Antihistamines/Decongestants:none Other therapy:none Hormone/birth control:none  Caffeine:1 cup of coffee daily, 3 cans Pepsi daily Smoker:Recently quit Diet:2 or 3 16oz bottles of water daily Exercise:Not routine Depression:sometimes; Anxiety:yes Other pain:Chronic back pain Sleep hygiene:Poor sleep 3-4 hours No history of concussion  HISTORY: Onset: Infrequently in her mid 50s. Migraines have been worse over the past 3 months, requiring an ED visit in May and June.She always has a constant dull headache with fluctuations. No preceding event. Location:Often start from back of head and radiates to front of head, usually right side Quality:Shooting pain, throbbing Initial intensity:6/10 (sometimes worse).Shedenies thunderclap headache Premonitory Phase:none Postdrome:none Aura: No Associated symptoms: Nausea, vomiting, photophobia, phonophobia, bilateral dilated pupils.Shedenies associated visual disturbance orunilateral numbness or weakness. Initial duration:5 hours Initial Frequency:Every 3 days Initial Frequency of abortive medication:Takes Excedrin Migraine daily Triggers: None Relieving factors: Sometimes Excedrin migraine Activity:Difficult to function when severe.  MRI of brain with and without contrast from 03/20/2019 showed incidental left maxillar and right sphenoid sinus mucous retention cysts but otherwise unremarkable.   Past NSAIDS:ASA, ibuprofen, naproxen Past analgesics:Tylenol; Excedrin Migraine Past abortive triptans:Sumatriptan 50mg  (made her feel sick) Past abortive ergotamine:none Past muscle relaxants:Flexeril Past anti-emetic:Reglan, Zofran ODT 4mg , Compazine 10mg  Past antihypertensive medications:none Past antidepressant  medications:Paxil Past anticonvulsant medications:none Past anti-CGRP:none Past vitamins/Herbal/Supplements:none Past antihistamines/decongestants:none Other past therapies:none   Family history of headache:Father (migraines), sister (occipital neuralgia)   Past Medical History: Past Medical History:  Diagnosis Date  . Anemia   . Anxiety   . Bipolar affective disorder (HCC)   . Chronic migraine w/o aura w/o status migrainosus, not intractable   . Depression     Medications: Outpatient Encounter Medications as of 11/12/2020  Medication Sig  . cyclobenzaprine (FLEXERIL)  10 MG tablet Take 1 tablet (10 mg total) by mouth 3 (three) times daily as needed for muscle spasms.  . hydrOXYzine (ATARAX/VISTARIL) 50 MG tablet TAKE 1 TABLET(50 MG) BY MOUTH THREE TIMES DAILY AS NEEDED  . ibuprofen (ADVIL) 800 MG tablet Take 1 tablet (800 mg total) by mouth every 8 (eight) hours as needed.  . Multiple Vitamin (MULTIVITAMIN WITH MINERALS) TABS tablet Take 1 tablet by mouth daily.  . naproxen (NAPROSYN) 500 MG tablet Take 1 tablet (500 mg total) by mouth 2 (two) times daily with a meal. Must have office visit for refills  . nortriptyline (PAMELOR) 50 MG capsule Take 1 capsule (50 mg total) by mouth at bedtime.  . [DISCONTINUED] ondansetron (ZOFRAN-ODT) 4 MG disintegrating tablet DISSOLVE 1 TABLET(4 MG) ON THE TONGUE EVERY 8 HOURS AS NEEDED FOR NAUSEA OR VOMITING  . [DISCONTINUED] rizatriptan (MAXALT) 10 MG tablet Take 1 tablet earliest onset of migraine.  May repeat in 2 hours if needed.  Maximum 2 tablets in 24 hours. 30day supply  . [DISCONTINUED] traZODone (DESYREL) 100 MG tablet Take 1-1.5 tablets (100-150 mg total) by mouth at bedtime.  . ondansetron (ZOFRAN-ODT) 4 MG disintegrating tablet Take 1 tablet (4 mg total) by mouth every 8 (eight) hours as needed for nausea or vomiting.  . rizatriptan (MAXALT) 10 MG tablet Take 1 tablet earliest onset of migraine.  May repeat in 2 hours  if needed.  Maximum 2 tablets in 24 hours. 30day supply  . [DISCONTINUED] PARoxetine (PAXIL) 10 MG tablet TK 1 T PO D FOR ANXIETY (Patient not taking: Reported on 11/12/2020)   No facility-administered encounter medications on file as of 11/12/2020.    Allergies: Allergies  Allergen Reactions  . Imitrex [Sumatriptan] Nausea And Vomiting and Other (See Comments)    altered    Family History: Family History  Problem Relation Age of Onset  . Depression Mother   . Migraines Father   . Healthy Child     Observations/Objective:   Height 5\' 4"  (1.626 m), weight 200 lb (90.7 kg), unknown if currently breastfeeding. No acute distress.  Alert and oriented.  Speech fluent and not dysarthric.  Language intact. Follow Up Instructions:    -I discussed the assessment and treatment plan with the patient. The patient was provided an opportunity to ask questions and all were answered. The patient agreed with the plan and demonstrated an understanding of the instructions.   The patient was advised to call back or seek an in-person evaluation if the symptoms worsen or if the condition fails to improve as anticipated.    , DO

## 2020-11-12 ENCOUNTER — Encounter: Payer: Self-pay | Admitting: Neurology

## 2020-11-12 ENCOUNTER — Telehealth (INDEPENDENT_AMBULATORY_CARE_PROVIDER_SITE_OTHER): Payer: Medicaid Other | Admitting: Neurology

## 2020-11-12 ENCOUNTER — Other Ambulatory Visit: Payer: Self-pay

## 2020-11-12 VITALS — Ht 64.0 in | Wt 200.0 lb

## 2020-11-12 DIAGNOSIS — G43009 Migraine without aura, not intractable, without status migrainosus: Secondary | ICD-10-CM

## 2020-11-12 MED ORDER — ONDANSETRON 4 MG PO TBDP: 4.0000 mg | ORAL_TABLET | Freq: Three times a day (TID) | ORAL | 5 refills | Status: DC | PRN

## 2020-11-12 MED ORDER — NORTRIPTYLINE HCL 50 MG PO CAPS: 50.0000 mg | ORAL_CAPSULE | Freq: Every day | ORAL | 5 refills | Status: DC

## 2020-11-12 MED ORDER — RIZATRIPTAN BENZOATE 10 MG PO TABS: ORAL_TABLET | ORAL | 5 refills | Status: AC

## 2020-12-02 ENCOUNTER — Other Ambulatory Visit: Payer: Self-pay | Admitting: Family Medicine

## 2020-12-02 DIAGNOSIS — F419 Anxiety disorder, unspecified: Secondary | ICD-10-CM

## 2020-12-02 NOTE — Telephone Encounter (Signed)
Requested Prescriptions  Pending Prescriptions Disp Refills  . hydrOXYzine (ATARAX/VISTARIL) 50 MG tablet [Pharmacy Med Name: HYDROXYZINE HCL 50MG  TABS (WHITE)] 90 tablet 1    Sig: TAKE 1 TABLET BY MOUTH THREE TIMES DAILY AS NEEDED     Ear, Nose, and Throat:  Antihistamines Passed - 12/02/2020  6:42 AM      Passed - Valid encounter within last 12 months    Recent Outpatient Visits          6 months ago Anxiety   [facility] [place], [place], NP   1 year ago Anxiety   [place] [facility] [place], [place], NP   1 year ago Bipolar affective disorder, remission status unspecified [facility])   [place] [facility] [place], [place], NP   1 year ago Migraine aura, persistent, intractable   [place] [facility], MD

## 2020-12-07 ENCOUNTER — Encounter: Payer: Self-pay | Admitting: Nurse Practitioner

## 2020-12-07 ENCOUNTER — Telehealth: Payer: Medicaid Other | Admitting: Family

## 2020-12-07 DIAGNOSIS — R197 Diarrhea, unspecified: Secondary | ICD-10-CM

## 2020-12-07 DIAGNOSIS — R109 Unspecified abdominal pain: Secondary | ICD-10-CM

## 2020-12-07 NOTE — Progress Notes (Signed)
Based on what you shared with me, I feel your condition warrants further evaluation and I recommend that you be seen in a face to face office visit.  Given your symptoms, you need to be seen face to face for further testing.    NOTE: If you entered your credit card information for this eVisit, you will not be charged. You may see a "hold" on your card for the $35 but that hold will drop off and you will not have a charge processed.   If you are having a true medical emergency please call 911.      For an urgent face to face visit, [place] has six urgent care centers for your convenience:     [facility] at [facility] Directions [phone] [address] [place], [place] [address] . 8 am - 4 pm Monday - Friday    [facility]) Get Driving Directions [phone] [address] [place], [place] [address] . 8 am to 8 pm Monday-Friday . 10 am to 6 pm [facility] Urgent [facility]) Get Driving Directions [phone]  [address] [place],  [place]  [address] . 8 am to 8 pm Monday-Friday . 8 am to 4 pm [facility]gent Care at [facility] Get Driving Directions [phone] [address] [place], [place] [address] . 8 am to 8 pm Monday-Friday . 8 am to 4 pm [facility] Urgent Care at [facility] Get Driving Directions  [phone] [address] [place], [place] [address] . 8 am to 8 pm Monday-Friday . 8 am to 4 pm [facility] Care at [facility] Directions [phone] [address], [address] [place], [place] [address] . 8 am to 8 pm Monday-Friday . 8 am to 4 pm Saturday-Sunday     Your MyChart E-visit questionnaire answers were reviewed by a board certified advanced clinical practitioner to complete your personal care  plan based on your specific symptoms.  Thank you for using e-Visits.

## 2020-12-07 NOTE — Progress Notes (Signed)
Based on what you shared with me, I feel your condition warrants further evaluation and I recommend that you be seen in a face to face office visit.  Given the length your diarrhea has been on going and the blood, you need to be seen in person for further testing.    NOTE: If you entered your credit card information for this eVisit, you will not be charged. You may see a "hold" on your card for the $35 but that hold will drop off and you will not have a charge processed.   If you are having a true medical emergency please call 911.      For an urgent face to face visit, [place] has six urgent care centers for your convenience:     [facility] at [facility] Directions [phone] [address] [place], [place] [address] . 8 am - 4 pm Monday - Friday    [facility]) Get Driving Directions [phone] [address] [place], [place] [address] . 8 am to 8 pm Monday-Friday . 10 am to 6 pm [facility] Urgent [facility]) Get Driving Directions [phone]  [address] [place],  [place]  [address] . 8 am to 8 pm Monday-Friday . 8 am to 4 pm [facility] Urgent Care at [facility] Get Driving Directions [phone] [address] [place], [place] [address] . 8 am to 8 pm Monday-Friday . 8 am to 4 pm [facility] Urgent Care at [facility] Get Driving Directions  [phone] [address] [place], [place] [address] . 8 am to 8 pm Monday-Friday . 8 am to 4 pm [facility] Urgent Care at [facility] Directions [phone] [address], [address] [place], [place] [address] . 8 am to 8 pm Monday-Friday . 8 am to 4 pm Saturday-Sunday     Your MyChart E-visit questionnaire answers were reviewed by a board certified advanced clinical  practitioner to complete your personal care plan based on your specific symptoms.  Thank you for using e-Visits.

## 2020-12-10 ENCOUNTER — Ambulatory Visit: Payer: Medicaid Other | Attending: Nurse Practitioner | Admitting: Nurse Practitioner

## 2020-12-10 ENCOUNTER — Other Ambulatory Visit: Payer: Self-pay

## 2020-12-10 ENCOUNTER — Encounter: Payer: Self-pay | Admitting: Nurse Practitioner

## 2020-12-10 DIAGNOSIS — K5901 Slow transit constipation: Secondary | ICD-10-CM | POA: Diagnosis not present

## 2020-12-10 MED ORDER — SENNOSIDES-DOCUSATE SODIUM 8.6-50 MG PO TABS: 2.0000 | ORAL_TABLET | Freq: Two times a day (BID) | ORAL | 1 refills | Status: AC

## 2020-12-10 NOTE — Progress Notes (Signed)
Virtual Visit via Telephone Note Due to national recommendations of social distancing due to COVID 19, telehealth visit is felt to be most appropriate for this patient at this time.  I discussed the limitations, risks, security and privacy concerns of performing an evaluation and management service by telephone and the availability of in person appointments. I also discussed with the patient that there may be a patient responsible charge related to this service. The patient expressed understanding and agreed to proceed.    I connected with Doris King on 12/10/20  at   1:30 PM EDT  EDT by telephone and verified that I am speaking with the correct person using two identifiers.   Consent I discussed the limitations, risks, security and privacy concerns of performing an evaluation and management service by telephone and the availability of in person appointments. I also discussed with the patient that there may be a patient responsible charge related to this service. The patient expressed understanding and agreed to proceed.   Location of Patient: Private Residence   Location of Provider: [facility]    Persons participating in Telemedicine visit: Bertram Denver FNP-BC YY Redwood CMA Sebrina Maranto    History of Present Illness: Telemedicine visit for: Constipation She has a past medical history of Anemia, Anxiety, Bipolar affective disorder, Chronic migraine w/o aura w/o status migrainosus, not intractable, and Depression.  GI PROBLEM She has not had a substantial bowel movement in 3 weeks. She did an enema last night which was ineffective. She has tried miralax which was ineffective as well as OTC laxatives which tend to make her sick on her stomach and cause significant discomfort. She is only drinking 2 bottles of water per day and not consistent with this. Diet is not ideal. She works 9 hours a day and food choices are limited to processed foods, take out  and snacking.   She denies any vomiting. Associated symptoms: nausea, bloating and abdominal pain. Current bowel movements: Hard pellets.   Past Medical History:  Diagnosis Date  . Anemia   . Anxiety   . Bipolar affective disorder (HCC)   . Chronic migraine w/o aura w/o status migrainosus, not intractable   . Depression     Past Surgical History:  Procedure Laterality Date  . CESAREAN SECTION  2016  . DILATION AND CURETTAGE OF UTERUS     EAB at 18wks  . TUBAL LIGATION Bilateral 05/01/2017   Procedure: POST PARTUM TUBAL LIGATION;  Surgeon: Reva Bores, MD;  [facility]: [facility] SU[facility];  Service: Gynecology;  Laterality: Bilateral;    Family History  Problem Relation Age of Onset  . Depression Mother   . Migraines Father   . Healthy Child     Social History   Socioeconomic History  . Marital status: Single    Spouse name: Not on file  . Number of children: 6  . Years of education: Not on file  . Highest education level: 11th grade  Occupational History  . Occupation: unemployed  Tobacco Use  . Smoking status: Current Every Day Smoker    Types: Cigarettes    Last attempt to quit: 10/06/2016    Years since quitting: 4.1  . Smokeless tobacco: Never Used  . Tobacco comment: 5 cigs/day  Vaping Use  . Vaping Use: Never used  Substance and Sexual Activity  . Alcohol use: No  . Drug use: No    Comment: positve THC during pregnancy  . Sexual activity: Yes    Birth  control/protection: Surgical  Other Topics Concern  . Not on file  Social History Narrative   Patient is right-handed. She lives with her boyfrien and children in a one level home. She drinks 1-2 cups of coffee, and three 8 oz bottles of Pepsi a day. She is active with her children.   Social Determinants of Health   Financial Resource Strain: Not on file  Food Insecurity: Not on file  Transportation Needs: Not on file  Physical Activity: Not on file  Stress: Not on file  Social Connections: Not on file      Observations/Objective: Awake, alert and oriented x 3   Review of Systems  Constitutional: Negative for fever, malaise/fatigue and weight loss.  HENT: Negative.  Negative for nosebleeds.   Eyes: Negative.  Negative for blurred vision, double vision and photophobia.  Respiratory: Negative.  Negative for cough and shortness of breath.   Cardiovascular: Negative.  Negative for chest pain, palpitations and leg swelling.  Gastrointestinal: Positive for abdominal pain, constipation and nausea. Negative for blood in stool, diarrhea, heartburn, melena and vomiting.  Musculoskeletal: Negative.  Negative for myalgias.  Neurological: Negative.  Negative for dizziness, focal weakness, seizures and headaches.  Psychiatric/Behavioral: Negative.  Negative for suicidal ideas.    Assessment and Plan: Diagnoses and all orders for this visit:  Slow transit constipation -     senna-docusate (SENOKOT-S) 8.6-50 MG tablet; Take 2 tablets by mouth 2 (two) times daily.     Follow Up Instructions Return for PAP SMEAR.     I discussed the assessment and treatment plan with the patient. The patient was provided an opportunity to ask questions and all were answered. The patient agreed with the plan and demonstrated an understanding of the instructions.   The patient was advised to call back or seek an in-person evaluation if the symptoms worsen or if the condition fails to improve as anticipated.  I provided 10 minutes of non-face-to-face time during this encounter including median intraservice time, reviewing previous notes, labs, imaging, medications and explaining diagnosis and management.  Claiborne Rigg, FNP-BC

## 2020-12-19 ENCOUNTER — Other Ambulatory Visit: Payer: Self-pay | Admitting: Nurse Practitioner

## 2020-12-19 NOTE — Telephone Encounter (Signed)
Requested medications are due for refill today.  unknown  Requested medications are on the active medications list.  no  Last refill. 11/20/2020  Future visit scheduled.   no  Notes to clinic.  Per note medication discontinued 11/12/2020 by Shon Millet. Please advise.

## 2020-12-25 ENCOUNTER — Encounter: Payer: Self-pay | Admitting: *Deleted

## 2021-01-18 ENCOUNTER — Telehealth: Payer: Medicaid Other | Admitting: Family

## 2021-01-18 DIAGNOSIS — M545 Low back pain, unspecified: Secondary | ICD-10-CM

## 2021-01-18 MED ORDER — NAPROXEN 500 MG PO TABS: 500.0000 mg | ORAL_TABLET | Freq: Two times a day (BID) | ORAL | 0 refills | Status: DC

## 2021-01-18 MED ORDER — BACLOFEN 10 MG PO TABS: 10.0000 mg | ORAL_TABLET | Freq: Three times a day (TID) | ORAL | 0 refills | Status: AC

## 2021-01-18 NOTE — Progress Notes (Signed)

## 2021-01-30 ENCOUNTER — Other Ambulatory Visit: Payer: Self-pay | Admitting: Family

## 2021-03-03 ENCOUNTER — Encounter: Payer: Self-pay | Admitting: Nurse Practitioner

## 2021-03-13 ENCOUNTER — Other Ambulatory Visit: Payer: Self-pay | Admitting: Nurse Practitioner

## 2021-03-13 DIAGNOSIS — F419 Anxiety disorder, unspecified: Secondary | ICD-10-CM

## 2021-03-13 MED ORDER — HYDROXYZINE HCL 50 MG PO TABS: 50.0000 mg | ORAL_TABLET | Freq: Three times a day (TID) | ORAL | 1 refills | Status: AC | PRN

## 2021-04-19 ENCOUNTER — Other Ambulatory Visit: Payer: Self-pay | Admitting: Neurology

## 2021-05-18 ENCOUNTER — Other Ambulatory Visit: Payer: Self-pay

## 2021-05-18 MED ORDER — NORTRIPTYLINE HCL 75 MG PO CAPS: 75.0000 mg | ORAL_CAPSULE | Freq: Every day | ORAL | 0 refills | Status: DC

## 2021-06-19 ENCOUNTER — Other Ambulatory Visit: Payer: Self-pay | Admitting: Neurology

## 2021-09-05 ENCOUNTER — Telehealth: Payer: Medicaid Other | Admitting: Nurse Practitioner

## 2021-09-05 DIAGNOSIS — M545 Low back pain, unspecified: Secondary | ICD-10-CM

## 2021-09-05 MED ORDER — CYCLOBENZAPRINE HCL 10 MG PO TABS: 10.0000 mg | ORAL_TABLET | Freq: Three times a day (TID) | ORAL | 1 refills | Status: AC | PRN

## 2021-09-05 MED ORDER — NAPROXEN 500 MG PO TABS: 500.0000 mg | ORAL_TABLET | Freq: Two times a day (BID) | ORAL | 1 refills | Status: AC

## 2021-09-05 NOTE — Progress Notes (Signed)

## 2021-11-09 ENCOUNTER — Encounter: Payer: Self-pay | Admitting: Neurology

## 2021-11-11 NOTE — Progress Notes (Deleted)
? ?NEUROLOGY FOLLOW UP OFFICE NOTE ? ?Leilanee Moretta ?[id number] ? ?Assessment/Plan:  ? ?Migraine without aura, without status migrainosus, not intractable. ?  ?1.  Migraine prevention:  Nortriptyline 50mg  at bedtime ?2.  Migraine rescue:  Ibuprofen (Rizatriptan 10mg  if severe). Zofran for nausea. ?3.  .Limit use of pain relievers to no more than 2 days out of week to prevent risk of rebound or medication-overuse headache. ?4.  Keep headache diary ?5.  Follow up one year ? ?Subjective:  ?Teofila Minyard is a 36 year old woman who follows up for migraine. ?  ?UPDATE: ?Intensity:  3-4/10 ?Duration:  45 minutes with ibuprofen or rizatriptan ?Frequency:  5-6 a month ?Current NSAIDS:  none ?Current analgesics:  none ?Current triptans:  rizatriptan 10mg  ?Current ergotamine:  none ?Current anti-emetic:  Zofran 4mg  ?Current muscle relaxants:  none ?Current anti-anxiolytic:  none ?Current sleep aide:  none ?Current Antihypertensive medications:  none ?Current Antidepressant medications:  nortriptyline 50mg  at bedtime ?Current Anticonvulsant medications:  none ?Current anti-CGRP:  none ?Current Vitamins/Herbal/Supplements:  MVI ?Current Antihistamines/Decongestants: none ?Other therapy:  none ?Hormone/birth control:  none ?  ?Caffeine:  1 cup of coffee daily, 3 cans Pepsi daily ?Smoker:  Recently quit ?Diet:  2 or 3 16oz bottles of water daily ?Exercise:  Not routine ?Depression:  sometimes; Anxiety:  yes ?Other pain:  Chronic back pain ?Sleep hygiene:  Poor sleep 3-4 hours ?No history of concussion ?  ?HISTORY:  ?Onset: Infrequently in her mid 42s.  Migraines have been worse over the past 3 months, requiring an ED visit in May and June.  She always has a constant dull headache with fluctuations.  No preceding event. ?Location:  Often start from back of head and radiates to front of head, usually right side ?Quality:  Shooting pain, throbbing ?Initial intensity:  6/10 (sometimes worse).  She denies thunderclap  headache ?Premonitory Phase:  none ?Postdrome:  none ?Aura:  No ?Associated symptoms: Nausea, vomiting, photophobia, phonophobia, bilateral dilated pupils.  She denies associated visual disturbance or unilateral numbness or weakness. ?Initial duration:  5 hours ?Initial Frequency:  Every 3 days ?Initial Frequency of abortive medication: Takes Excedrin Migraine daily ?Triggers: None ?Relieving factors: Sometimes Excedrin migraine ?Activity:  Difficult to function when severe. ?  ?MRI of brain with and without contrast from 03/20/2019 showed incidental left maxillar and right sphenoid sinus mucous retention cysts but otherwise unremarkable.  ?  ?Past NSAIDS:  ASA, ibuprofen, naproxen ?Past analgesics:  Tylenol; Excedrin Migraine ?Past abortive triptans:  Sumatriptan 50mg  (made her feel sick) ?Past abortive ergotamine:  none ?Past muscle relaxants:  Flexeril ?Past anti-emetic:  Reglan, Zofran ODT 4mg , Compazine 10mg  ?Past antihypertensive medications:  none ?Past antidepressant medications:  Paxil ?Past anticonvulsant medications:  none ?Past anti-CGRP:  none ?Past vitamins/Herbal/Supplements:  none ?Past antihistamines/decongestants:  none ?Other past therapies:  none ?  ?  ?Family history of headache:  Father (migraines), sister (occipital neuralgia) ? ?PAST MEDICAL HISTORY: ?Past Medical History:  ?Diagnosis Date  ? Anemia   ? Anxiety   ? Bipolar affective disorder ([place])   ? Chronic migraine w/o aura w/o status migrainosus, not intractable   ? Depression   ? ? ?MEDICATIONS: ?Current Outpatient Medications on File Prior to Visit  ?Medication Sig Dispense Refill  ? baclofen (LIORESAL) 10 MG tablet Take 1 tablet (10 mg total) by mouth 3 (three) times daily. 30 each 0  ? cyclobenzaprine (FLEXERIL) 10 MG tablet Take 1 tablet (10 mg total) by mouth 3 (three) times daily as needed for  muscle spasms. 30 tablet 1  ? Multiple Vitamin (MULTIVITAMIN WITH MINERALS) TABS tablet Take 1 tablet by mouth daily.    ? naproxen  (NAPROSYN) 500 MG tablet Take 1 tablet (500 mg total) by mouth 2 (two) times daily with a meal. 60 tablet 1  ? nortriptyline (PAMELOR) 75 MG capsule TAKE 1 CAPSULE(75 MG) BY MOUTH AT BEDTIME 30 capsule 5  ? ondansetron (ZOFRAN-ODT) 4 MG disintegrating tablet DISSOLVE 1 TABLET(4 MG) ON THE TONGUE EVERY 8 HOURS AS NEEDED FOR NAUSEA OR VOMITING 20 tablet 0  ? rizatriptan (MAXALT) 10 MG tablet Take 1 tablet earliest onset of migraine.  May repeat in 2 hours if needed.  Maximum 2 tablets in 24 hours. 30day supply 10 tablet 5  ? ?No current facility-administered medications on file prior to visit.  ? ? ?ALLERGIES: ?Allergies  ?Allergen Reactions  ? Imitrex [Sumatriptan] Nausea And Vomiting and Other (See Comments)  ?  altered  ? ? ?FAMILY HISTORY: ?Family History  ?Problem Relation Age of Onset  ? Depression Mother   ? Migraines Father   ? Healthy Child   ? ? ?  ?Objective:  ?*** ?General: No acute distress.  Patient appears ***-groomed.   ?Head:  Normocephalic/atraumatic ?Eyes:  Fundi examined but not visualized ?Neck: supple, no paraspinal tenderness, full range of motion ?Heart:  Regular rate and rhythm ?Lungs:  Clear to auscultation bilaterally ?Back: No paraspinal tenderness ?Neurological Exam: alert and oriented to person, place, and time.  Speech fluent and not dysarthric, language intact.  CN II-XII intact. Bulk and tone normal, muscle strength 5/5 throughout.  Sensation to light touch intact.  Deep tendon reflexes 2+ throughout, toes downgoing.  Finger to nose testing intact.  Gait normal, Romberg negative. ? ? ?Metta Clines, DO ? ?CC: *** ? ? ? ? ? ? ?

## 2021-11-12 ENCOUNTER — Ambulatory Visit: Payer: Medicaid Other | Admitting: Neurology

## 2021-11-12 ENCOUNTER — Encounter: Payer: Self-pay | Admitting: Neurology

## 2021-11-12 DIAGNOSIS — Z029 Encounter for administrative examinations, unspecified: Secondary | ICD-10-CM

## 2021-11-21 ENCOUNTER — Other Ambulatory Visit: Payer: Self-pay | Admitting: Nurse Practitioner

## 2021-11-23 ENCOUNTER — Other Ambulatory Visit: Payer: Self-pay | Admitting: Nurse Practitioner

## 2021-11-23 DIAGNOSIS — F419 Anxiety disorder, unspecified: Secondary | ICD-10-CM

## 2021-12-15 ENCOUNTER — Other Ambulatory Visit: Payer: Self-pay | Admitting: Family Medicine

## 2021-12-15 DIAGNOSIS — F419 Anxiety disorder, unspecified: Secondary | ICD-10-CM

## 2022-02-01 ENCOUNTER — Other Ambulatory Visit: Payer: Self-pay | Admitting: Neurology

## 2022-03-18 ENCOUNTER — Other Ambulatory Visit: Payer: Self-pay | Admitting: Nurse Practitioner
